# Patient Record
Sex: Female | Born: 1970 | Race: White | Hispanic: No | Marital: Married | Smoking: Former smoker
Health system: Southern US, Community
[De-identification: ages and names within clinical notes are randomized; demographics above are authoritative.]

## PROBLEM LIST (undated history)

## (undated) DIAGNOSIS — G43829 Menstrual migraine, not intractable, without status migrainosus: Secondary | ICD-10-CM

## (undated) DIAGNOSIS — N760 Acute vaginitis: Secondary | ICD-10-CM

## (undated) DIAGNOSIS — R519 Headache, unspecified: Secondary | ICD-10-CM

## (undated) DIAGNOSIS — E559 Vitamin D deficiency, unspecified: Secondary | ICD-10-CM

## (undated) DIAGNOSIS — R51 Headache: Secondary | ICD-10-CM

## (undated) DIAGNOSIS — E785 Hyperlipidemia, unspecified: Secondary | ICD-10-CM

## (undated) DIAGNOSIS — R87629 Unspecified abnormal cytological findings in specimens from vagina: Secondary | ICD-10-CM

## (undated) DIAGNOSIS — E039 Hypothyroidism, unspecified: Secondary | ICD-10-CM

## (undated) DIAGNOSIS — F3281 Premenstrual dysphoric disorder: Secondary | ICD-10-CM

## (undated) DIAGNOSIS — R638 Other symptoms and signs concerning food and fluid intake: Secondary | ICD-10-CM

## (undated) HISTORY — DX: Hypothyroidism, unspecified: E03.9

## (undated) HISTORY — DX: Acute vaginitis: N76.0

## (undated) HISTORY — DX: Hyperlipidemia, unspecified: E78.5

## (undated) HISTORY — DX: Headache, unspecified: R51.9

## (undated) HISTORY — DX: Headache: R51

## (undated) HISTORY — DX: Unspecified abnormal cytological findings in specimens from vagina: R87.629

## (undated) HISTORY — PX: TUBAL LIGATION: SHX77

## (undated) HISTORY — DX: Menstrual migraine, not intractable, without status migrainosus: G43.829

## (undated) HISTORY — PX: REFRACTIVE SURGERY: SHX103

## (undated) HISTORY — DX: Other symptoms and signs concerning food and fluid intake: R63.8

## (undated) HISTORY — DX: Vitamin D deficiency, unspecified: E55.9

## (undated) HISTORY — DX: Premenstrual dysphoric disorder: F32.81

---

## 2012-03-03 ENCOUNTER — Other Ambulatory Visit: Payer: Self-pay | Admitting: Family Medicine

## 2012-03-03 LAB — TSH: Thyroid Stimulating Horm: 2.14 u[IU]/mL

## 2012-12-07 ENCOUNTER — Other Ambulatory Visit: Payer: Self-pay | Admitting: Family Medicine

## 2012-12-07 LAB — LIPID PANEL
Cholesterol: 199 mg/dL (ref 0–200)
Ldl Cholesterol, Calc: 97 mg/dL (ref 0–100)
Triglycerides: 308 mg/dL — ABNORMAL HIGH (ref 0–200)

## 2012-12-07 LAB — TSH: Thyroid Stimulating Horm: 2.99 u[IU]/mL

## 2013-01-05 ENCOUNTER — Ambulatory Visit: Payer: Self-pay | Admitting: Obstetrics and Gynecology

## 2013-11-04 DIAGNOSIS — F32A Depression, unspecified: Secondary | ICD-10-CM | POA: Insufficient documentation

## 2013-11-04 DIAGNOSIS — R87619 Unspecified abnormal cytological findings in specimens from cervix uteri: Secondary | ICD-10-CM | POA: Insufficient documentation

## 2013-11-04 DIAGNOSIS — B977 Papillomavirus as the cause of diseases classified elsewhere: Secondary | ICD-10-CM | POA: Insufficient documentation

## 2013-11-04 DIAGNOSIS — F329 Major depressive disorder, single episode, unspecified: Secondary | ICD-10-CM | POA: Insufficient documentation

## 2014-01-06 DIAGNOSIS — E782 Mixed hyperlipidemia: Secondary | ICD-10-CM | POA: Insufficient documentation

## 2014-01-25 LAB — HM PAP SMEAR: HM PAP: NEGATIVE

## 2014-02-09 ENCOUNTER — Ambulatory Visit: Payer: Self-pay | Admitting: Obstetrics and Gynecology

## 2014-02-09 LAB — HM MAMMOGRAPHY

## 2014-07-07 DIAGNOSIS — E559 Vitamin D deficiency, unspecified: Secondary | ICD-10-CM | POA: Insufficient documentation

## 2014-07-07 DIAGNOSIS — J309 Allergic rhinitis, unspecified: Secondary | ICD-10-CM | POA: Insufficient documentation

## 2014-07-07 DIAGNOSIS — R946 Abnormal results of thyroid function studies: Secondary | ICD-10-CM | POA: Insufficient documentation

## 2015-01-30 ENCOUNTER — Encounter: Payer: Self-pay | Admitting: Obstetrics and Gynecology

## 2015-01-30 ENCOUNTER — Ambulatory Visit (INDEPENDENT_AMBULATORY_CARE_PROVIDER_SITE_OTHER): Payer: BC Managed Care – PPO | Admitting: Obstetrics and Gynecology

## 2015-01-30 VITALS — BP 136/74 | HR 83 | Ht 65.0 in | Wt 185.8 lb

## 2015-01-30 DIAGNOSIS — Z202 Contact with and (suspected) exposure to infections with a predominantly sexual mode of transmission: Secondary | ICD-10-CM | POA: Diagnosis not present

## 2015-01-30 DIAGNOSIS — R638 Other symptoms and signs concerning food and fluid intake: Secondary | ICD-10-CM | POA: Diagnosis not present

## 2015-01-30 DIAGNOSIS — N762 Acute vulvitis: Secondary | ICD-10-CM | POA: Insufficient documentation

## 2015-01-30 DIAGNOSIS — G43909 Migraine, unspecified, not intractable, without status migrainosus: Secondary | ICD-10-CM | POA: Insufficient documentation

## 2015-01-30 DIAGNOSIS — Z01419 Encounter for gynecological examination (general) (routine) without abnormal findings: Secondary | ICD-10-CM

## 2015-01-30 DIAGNOSIS — Z1239 Encounter for other screening for malignant neoplasm of breast: Secondary | ICD-10-CM | POA: Diagnosis not present

## 2015-01-30 NOTE — Patient Instructions (Signed)
1.  Pap smear/HPV testing is done today. 2.  STD testing is done today. 3.  Mammogram is ordered. 4.  Healthy eating and exercise with the goal to achieve temp at weight loss over one year.  Is encouraged. 5.  Nystatin/triamcinolone cream is prescribed to treat vulvar irritation.  Apply medication to the vulva and perianal region twice a day for 10 days to 14 days. 6.  Return in 1 year.

## 2015-01-30 NOTE — Progress Notes (Signed)
Patient ID: Nancy CousinsSue Patterson, female   DOB: Aug 02, 1970, 45 y.o.   MRN: [id number] ANNUAL PREVENTATIVE CARE GYN  ENCOUNTER NOTE  Subjective:       Nancy CousinsSue Patterson is a 45 y.o. 522P2002 female here for a routine annual gynecologic exam.  Current complaints: 1.  Std testing-Patient is going through divorce.  Patient reports regular cycles, but with slightly heavier bleeding and 1-2 days of clots; no intermenstrual spotting.  No vaginal discharge.She has lost 2 pounds in the past year.   Gynecologic History Patient's last menstrual period was 01/24/2015 (exact date). Contraception: tubal ligation Last Pap: 01/25/2014 pap w/rflx- neg. Results were: normal Last mammogram: 02/09/2014 -birad 1. Results were: normal  Obstetric History OB History  Gravida Para Term Preterm AB SAB TAB Ectopic Multiple Living  2 2 2       2     # Outcome Date GA Lbr Len/2nd Weight Sex Delivery Anes PTL Lv  2 Term      Vag-Spont   Y  1 Term      Vag-Spont   Y      Past Medical History  Diagnosis Date  . Vaginal Pap smear, abnormal     cervical dysplasia  . Hypothyroidism   . Headache     migraine  . Vaginitis   . Menstrual headache   . Vitamin D deficiency   . PMDD (premenstrual dysphoric disorder)   . Increased BMI     Past Surgical History  Procedure Laterality Date  . Tubal ligation    . Refractive surgery      No current outpatient prescriptions on file prior to visit.   No current facility-administered medications on file prior to visit.    Allergies  Allergen Reactions  . Azithromycin     Social History   Social History  . Marital Status: Divorced    Spouse Name: N/A  . Number of Children: N/A  . Years of Education: N/A   Occupational History  . Not on file.   Social History Main Topics  . Smoking status: Former Games developermoker  . Smokeless tobacco: Not on file  . Alcohol Use: Yes     Comment: occas  . Drug Use: No  . Sexual Activity: Yes    Birth Control/ Protection: Surgical   Other  Topics Concern  . Not on file   Social History Narrative    Family History  Problem Relation Age of Onset  . Heart disease Father   . Colon cancer Paternal Uncle   . Hyperlipidemia Paternal Grandmother   . Breast cancer Neg Hx   . Ovarian cancer Neg Hx   . Diabetes Mother     The following portions of the patient's history were reviewed and updated as appropriate: allergies, current medications, past family history, past medical history, past social history, past surgical history and problem list.  Review of Systems ROS Review of Systems - General ROS: negative for - chills, fatigue, fever, hot flashes, night sweats, weight gain or weight loss Psychological ROS: negative for - anxiety, decreased libido, depression, mood swings, physical abuse or sexual abuse Ophthalmic ROS: negative for - blurry vision, eye pain or loss of vision ENT ROS: negative for - headaches, hearing change, visual changes or vocal changes Allergy and Immunology ROS: negative for - hives, itchy/watery eyes or seasonal allergies Hematological and Lymphatic ROS: negative for - bleeding problems, bruising, swollen lymph nodes or weight loss Endocrine ROS: negative for - galactorrhea, hair pattern changes, hot flashes, malaise/lethargy, mood swings,  palpitations, polydipsia/polyuria, skin changes, temperature intolerance or unexpected weight changes Breast ROS: negative for - new or changing breast lumps or nipple discharge Respiratory ROS: negative for - cough or shortness of breath Cardiovascular ROS: negative for - chest pain, irregular heartbeat, palpitations or shortness of breath Gastrointestinal ROS: no abdominal pain, change in bowel habits, or black or bloody stools Genito-Urinary ROS: no dysuria, trouble voiding, or hematuria Musculoskeletal ROS: negative for - joint pain or joint stiffness Neurological ROS: negative for - bowel and bladder control changes Dermatological ROS: negative for rash and skin  lesion changes   Objective:   BP 136/74 mmHg  Pulse 83  Ht 5\' 5"  (1.651 m)  Wt 185 lb 12.8 oz (84.278 kg)  BMI 30.92 kg/m2  LMP 01/24/2015 (Exact Date) CONSTITUTIONAL: Well-developed, well-nourished female in no acute distress.  PSYCHIATRIC: Normal mood and affect. Normal behavior. Normal judgment and thought content. NEUROLGIC: Alert and oriented to person, place, and time. Normal muscle tone coordination. No cranial nerve deficit noted. HENT:  Normocephalic, atraumatic, External right and left ear normal. Oropharynx is clear and moist EYES: Conjunctivae and EOM are normal. Pupils are equal, round, and reactive to light. No scleral icterus.  NECK: Normal range of motion, supple, no masses.  Normal thyroid.  SKIN: Skin is warm and dry. No rash noted. Not diaphoretic. No erythema. No pallor. CARDIOVASCULAR: Normal heart rate noted, regular rhythm, no murmur. RESPIRATORY: Clear to auscultation bilaterally. Effort and breath sounds normal, no problems with respiration noted. BREASTS: Symmetric in size. No masses, skin changes, nipple drainage, or lymphadenopathy. ABDOMEN: Soft, normal bowel sounds, no distention noted.  No tenderness, rebound or guarding.  BLADDER: Normal PELVIC:  External Genitalia: Vulvar and perianal hyperemia  BUS: Normal  Vagina: Normal  Cervix: Normal  Uterus: Normal  Adnexa: Normal  RV: External Exam NormaI, No Rectal Masses and Normal Sphincter tone  MUSCULOSKELETAL: Normal range of motion. No tenderness.  No cyanosis, clubbing, or edema.  2+ distal pulses. LYMPHATIC: No Axillary, Supraclavicular, or Inguinal Adenopathy.    Assessment:   Annual gynecologic examination 45 y.o. Contraception: tubal ligation bmi31 Possible STD exposure (patient going through divorce). Vulvitis/perianal inflammation  Plan:  Pap: Pap Co Test and g/c Mammogram: Ordered Stool Guaiac Testing:  Not Indicated Labs: thru pcp Routine preventative health maintenance measures  emphasized: Exercise/Diet/Weight control, Tobacco Warnings, Alcohol/Substance use risks and Safe Sex Nystatin/triamcinolone cream to be applied to the vulva twice a day for 14 days. Encourage weight loss, approximately 10 pounds per year Through Healthy exercise. STI testing is completed today Return to Clinic - 1 Year   Crystal [place], CMA  Nancy Harms, MD  Note: This dictation was prepared with Dragon dictation along with smaller phrase technology. Any transcriptional errors that result from this process are unintentional.

## 2015-01-31 ENCOUNTER — Encounter: Payer: Self-pay | Admitting: Obstetrics and Gynecology

## 2015-01-31 LAB — HSV(HERPES SIMPLEX VRS) I + II AB-IGG
HSV 1 GLYCOPROTEIN G AB, IGG: 7.17 {index} — AB (ref 0.00–0.90)
HSV 2 Glycoprotein G Ab, IgG: <0.91 index (ref 0.00–0.90)

## 2015-01-31 LAB — HIV ANTIBODY (ROUTINE TESTING W REFLEX): HIV SCREEN 4TH GENERATION: NONREACTIVE

## 2015-01-31 LAB — RPR: RPR Ser Ql: NONREACTIVE

## 2015-01-31 LAB — HEPATITIS C ANTIBODY

## 2015-01-31 LAB — HEPATITIS B SURFACE ANTIGEN: Hepatitis B Surface Ag: NEGATIVE

## 2015-02-01 MED ORDER — NYSTATIN-TRIAMCINOLONE 100000-0.1 UNIT/GM-% EX OINT: 1.0000 "application " | TOPICAL_OINTMENT | Freq: Two times a day (BID) | CUTANEOUS | Status: DC

## 2015-02-01 NOTE — Telephone Encounter (Signed)
Pt aware thru my chart message. Med rex.

## 2015-02-02 LAB — PAP IG, CT-NG NAA, HPV HIGH-RISK
CHLAMYDIA, NUC. ACID AMP: NEGATIVE
GONOCOCCUS BY NUCLEIC ACID AMP: NEGATIVE
HPV, high-risk: NEGATIVE
PAP Smear Comment: 0

## 2015-02-12 ENCOUNTER — Ambulatory Visit
Admission: RE | Admit: 2015-02-12 | Discharge: 2015-02-12 | Disposition: A | Discharge status: Home / Self Care | Payer: BC Managed Care – PPO | Source: Ambulatory Visit | Attending: Obstetrics and Gynecology | Admitting: Obstetrics and Gynecology

## 2015-02-12 ENCOUNTER — Other Ambulatory Visit: Payer: Self-pay | Admitting: Obstetrics and Gynecology

## 2015-02-12 DIAGNOSIS — Z1239 Encounter for other screening for malignant neoplasm of breast: Secondary | ICD-10-CM

## 2015-02-12 DIAGNOSIS — Z1231 Encounter for screening mammogram for malignant neoplasm of breast: Secondary | ICD-10-CM | POA: Diagnosis not present

## 2016-01-31 ENCOUNTER — Encounter: Payer: Self-pay | Admitting: Obstetrics and Gynecology

## 2016-01-31 ENCOUNTER — Ambulatory Visit (INDEPENDENT_AMBULATORY_CARE_PROVIDER_SITE_OTHER): Payer: BC Managed Care – PPO | Admitting: Obstetrics and Gynecology

## 2016-01-31 VITALS — BP 97/65 | HR 93 | Ht 65.0 in | Wt 189.0 lb

## 2016-01-31 DIAGNOSIS — Z01419 Encounter for gynecological examination (general) (routine) without abnormal findings: Secondary | ICD-10-CM | POA: Diagnosis not present

## 2016-01-31 DIAGNOSIS — R638 Other symptoms and signs concerning food and fluid intake: Secondary | ICD-10-CM | POA: Diagnosis not present

## 2016-01-31 DIAGNOSIS — Z202 Contact with and (suspected) exposure to infections with a predominantly sexual mode of transmission: Secondary | ICD-10-CM

## 2016-01-31 DIAGNOSIS — E039 Hypothyroidism, unspecified: Secondary | ICD-10-CM

## 2016-01-31 DIAGNOSIS — E559 Vitamin D deficiency, unspecified: Secondary | ICD-10-CM

## 2016-01-31 DIAGNOSIS — Z1239 Encounter for other screening for malignant neoplasm of breast: Secondary | ICD-10-CM

## 2016-01-31 DIAGNOSIS — E785 Hyperlipidemia, unspecified: Secondary | ICD-10-CM

## 2016-01-31 DIAGNOSIS — Z1231 Encounter for screening mammogram for malignant neoplasm of breast: Secondary | ICD-10-CM

## 2016-01-31 NOTE — Progress Notes (Signed)
ANNUAL PREVENTATIVE CARE GYN  ENCOUNTER NOTE  Subjective:       Nancy Patterson is a 46 y.o. [id number] female here for a routine annual gynecologic exam.  Current complaints: 1.  None  Patient is monogamous; desires STD screening. Bowel function is normal. Bladder function is normal. No major no interval health issues.   Gynecologic History Patient's last menstrual period was 01/03/2016 (exact date). Contraception: tubal ligation Last Pap: 01/2015 neg/neg. Results were: normal Last mammogram: 02/12/2015 birad 1. Results were: normal  Obstetric History OB History  Gravida Para Term Preterm AB Living  2 2 2     2   SAB TAB Ectopic Multiple Live Births          2    # Outcome Date GA Lbr Len/2nd Weight Sex Delivery Anes PTL Lv  2 Term      Vag-Spont   LIV  1 Term      Vag-Spont   LIV      Past Medical History:  Diagnosis Date  . Headache    migraine  . Hypothyroidism   . Increased BMI   . Menstrual headache   . PMDD (premenstrual dysphoric disorder)   . Vaginal Pap smear, abnormal    cervical dysplasia  . Vaginitis   . Vitamin D deficiency     Past Surgical History:  Procedure Laterality Date  . REFRACTIVE SURGERY    . TUBAL LIGATION      Current Outpatient Prescriptions on File Prior to Visit  Medication Sig Dispense Refill  . atorvastatin (LIPITOR) 10 MG tablet     . citalopram (CELEXA) 40 MG tablet Take by mouth.    . clonazePAM (KLONOPIN) 1 MG tablet Take by mouth.    . levothyroxine (SYNTHROID, LEVOTHROID) 25 MCG tablet     . Multiple Vitamin (MULTIVITAMIN) capsule Take 1 capsule by mouth daily.    Marland Kitchen. RA KRILL OIL 500 MG CAPS Take by mouth.     No current facility-administered medications on file prior to visit.     Allergies  Allergen Reactions  . Azithromycin     Social History   Social History  . Marital status: Divorced    Spouse name: N/A  . Number of children: N/A  . Years of education: N/A   Occupational History  . Not on file.   Social  History Main Topics  . Smoking status: Former Games developermoker  . Smokeless tobacco: Not on file  . Alcohol use Yes     Comment: occas  . Drug use: No  . Sexual activity: Yes    Birth control/ protection: Surgical   Other Topics Concern  . Not on file   Social History Narrative  . No narrative on file    Family History  Problem Relation Age of Onset  . Heart disease Father   . Colon cancer Paternal Uncle   . Hyperlipidemia Paternal Grandmother   . Diabetes Mother   . Breast cancer Neg Hx   . Ovarian cancer Neg Hx     The following portions of the patient's history were reviewed and updated as appropriate: allergies, current medications, past family history, past medical history, past social history, past surgical history and problem list.  Review of Systems ROS Review of Systems - General ROS: negative for - chills, fatigue, fever, hot flashes, night sweats, weight gain or weight loss Psychological ROS: negative for - anxiety, decreased libido, depression, mood swings, physical abuse or sexual abuse Ophthalmic ROS: negative for - blurry vision, eye  pain or loss of vision ENT ROS: negative for - headaches, hearing change, visual changes or vocal changes Allergy and Immunology ROS: negative for - hives, itchy/watery eyes or seasonal allergies Hematological and Lymphatic ROS: negative for - bleeding problems, bruising, swollen lymph nodes or weight loss Endocrine ROS: negative for - galactorrhea, hair pattern changes, hot flashes, malaise/lethargy, mood swings, palpitations, polydipsia/polyuria, skin changes, temperature intolerance or unexpected weight changes Breast ROS: negative for - new or changing breast lumps or nipple discharge Respiratory ROS: negative for - cough or shortness of breath Cardiovascular ROS: negative for - chest pain, irregular heartbeat, palpitations or shortness of breath Gastrointestinal ROS: no abdominal pain, change in bowel habits, or black or bloody  stools Genito-Urinary ROS: no dysuria, trouble voiding, or hematuria Musculoskeletal ROS: negative for - joint pain or joint stiffness Neurological ROS: negative for - bowel and bladder control changes Dermatological ROS: negative for rash and skin lesion changes   Objective:   BP 97/65   Pulse 93   Ht 5\' 5"  (1.651 m)   Wt 189 lb (85.7 kg)   LMP 01/03/2016 (Exact Date)   BMI 31.45 kg/m  CONSTITUTIONAL: Well-developed, well-nourished female in no acute distress.  PSYCHIATRIC: Normal mood and affect. Normal behavior. Normal judgment and thought content. NEUROLGIC: Alert and oriented to person, place, and time. Normal muscle tone coordination. No cranial nerve deficit noted. HENT:  Normocephalic, atraumatic, External right and left ear normal. Oropharynx is clear and moist EYES: Conjunctivae and EOM are normal.. No scleral icterus.  NECK: Normal range of motion, supple, no masses.  Normal thyroid.  SKIN: Skin is warm and dry. No rash noted. Not diaphoretic. No erythema. No pallor. CARDIOVASCULAR: Normal heart rate noted, regular rhythm, no murmur. RESPIRATORY: Clear to auscultation bilaterally. Effort and breath sounds normal, no problems with respiration noted. BREASTS: Symmetric in size. No masses, skin changes, nipple drainage, or lymphadenopathy. ABDOMEN: Soft, normal bowel sounds, no distention noted.  No tenderness, rebound or guarding.  BLADDER: Normal PELVIC:  External Genitalia: Normal  BUS: Normal  Vagina: Normal  Cervix: Normal; parous; no lesions  Uterus: Normal; midplane, normal size and shape, mobile, nontender  Adnexa: Normal; nonpalpable and nontender  RV: External Exam NormaI, No Rectal Masses and Normal Sphincter tone  MUSCULOSKELETAL: Normal range of motion. No tenderness.  No cyanosis, clubbing, or edema.  2+ distal pulses. LYMPHATIC: No Axillary, Supraclavicular, or Inguinal Adenopathy.    Assessment:   Annual gynecologic examination 46 y.o. Contraception:  tubal ligation bmi- 31 History of cervical dysplasia STD exposure, possible; desires screening  Plan:  Pap: Pap, Reflex if ASCUS and GC/CT NAAT- per pt request Mammogram: Ordered Stool Guaiac Testing:  Not Indicated Labs: vit d lipids tsh a1c fbs and std testing Routine preventative health maintenance measures emphasized: Exercise/Diet/Weight control, Tobacco Warnings, Alcohol/Substance use risks and Safe Sex Return to Clinic - 1 Year   Crystal [place], [place]  Herold Harms, MD  Note: This dictation was prepared with Dragon dictation along with smaller phrase technology. Any transcriptional errors that result from this process are unintentional.

## 2016-01-31 NOTE — Patient Instructions (Addendum)
1. Pap smear 2. STD testing 3. Mammogram ordered 4. Screening labs are ordered 5. Continue with healthy eating, exercise, and controlled weight loss 6. Safe sex practices encouraged 7. Return in 1 year for annual exam  Health Maintenance, Female Introduction Adopting a healthy lifestyle and getting preventive care can go a long way to promote health and wellness. Talk with your health care provider about what schedule of regular examinations is right for you. This is a good chance for you to check in with your provider about disease prevention and staying healthy. In between checkups, there are plenty of things you can do on your own. Experts have done a lot of research about which lifestyle changes and preventive measures are most likely to keep you healthy. Ask your health care provider for more information. Weight and diet Eat a healthy diet  Be sure to include plenty of vegetables, fruits, low-fat dairy products, and lean protein.  Do not eat a lot of foods high in solid fats, added sugars, or salt.  Get regular exercise. This is one of the most important things you can do for your health.  Most adults should exercise for at least 150 minutes each week. The exercise should increase your heart rate and make you sweat (moderate-intensity exercise).  Most adults should also do strengthening exercises at least twice a week. This is in addition to the moderate-intensity exercise. Maintain a healthy weight  Body mass index (BMI) is a measurement that can be used to identify possible weight problems. It estimates body fat based on height and weight. Your health care provider can help determine your BMI and help you achieve or maintain a healthy weight.  For females 40 years of age and older:  A BMI below 18.5 is considered underweight.  A BMI of 18.5 to 24.9 is normal.  A BMI of 25 to 29.9 is considered overweight.  A BMI of 30 and above is considered obese. Watch levels of  cholesterol and blood lipids  You should start having your blood tested for lipids and cholesterol at 46 years of age, then have this test every 5 years.  You may need to have your cholesterol levels checked more often if:  Your lipid or cholesterol levels are high.  You are older than 46 years of age.  You are at high risk for heart disease. Cancer screening Lung Cancer  Lung cancer screening is recommended for adults 62-32 years old who are at high risk for lung cancer because of a history of smoking.  A yearly low-dose CT scan of the lungs is recommended for people who:  Currently smoke.  Have quit within the past 15 years.  Have at least a 30-pack-year history of smoking. A pack year is smoking an average of one pack of cigarettes a day for 1 year.  Yearly screening should continue until it has been 15 years since you quit.  Yearly screening should stop if you develop a health problem that would prevent you from having lung cancer treatment. Breast Cancer  Practice breast self-awareness. This means understanding how your breasts normally appear and feel.  It also means doing regular breast self-exams. Let your health care provider know about any changes, no matter how small.  If you are in your 20s or 30s, you should have a clinical breast exam (CBE) by a health care provider every 1-3 years as part of a regular health exam.  If you are 56 or older, have a CBE every year.  Also consider having a breast X-ray (mammogram) every year.  If you have a family history of breast cancer, talk to your health care provider about genetic screening.  If you are at high risk for breast cancer, talk to your health care provider about having an MRI and a mammogram every year.  Breast cancer gene (BRCA) assessment is recommended for women who have family members with BRCA-related cancers. BRCA-related cancers include:  Breast.  Ovarian.  Tubal.  Peritoneal cancers.  Results of  the assessment will determine the need for genetic counseling and BRCA1 and BRCA2 testing. Cervical Cancer  Your health care provider may recommend that you be screened regularly for cancer of the pelvic organs (ovaries, uterus, and vagina). This screening involves a pelvic examination, including checking for microscopic changes to the surface of your cervix (Pap test). You may be encouraged to have this screening done every 3 years, beginning at age 89.  For women ages 57-65, health care providers may recommend pelvic exams and Pap testing every 3 years, or they may recommend the Pap and pelvic exam, combined with testing for human papilloma virus (HPV), every 5 years. Some types of HPV increase your risk of cervical cancer. Testing for HPV may also be done on women of any age with unclear Pap test results.  Other health care providers may not recommend any screening for nonpregnant women who are considered low risk for pelvic cancer and who do not have symptoms. Ask your health care provider if a screening pelvic exam is right for you.  If you have had past treatment for cervical cancer or a condition that could lead to cancer, you need Pap tests and screening for cancer for at least 20 years after your treatment. If Pap tests have been discontinued, your risk factors (such as having a new sexual partner) need to be reassessed to determine if screening should resume. Some women have medical problems that increase the chance of getting cervical cancer. In these cases, your health care provider may recommend more frequent screening and Pap tests. Colorectal Cancer  This type of cancer can be detected and often prevented.  Routine colorectal cancer screening usually begins at 46 years of age and continues through 46 years of age.  Your health care provider may recommend screening at an earlier age if you have risk factors for colon cancer.  Your health care provider may also recommend using home test  kits to check for hidden blood in the stool.  A small camera at the end of a tube can be used to examine your colon directly (sigmoidoscopy or colonoscopy). This is done to check for the earliest forms of colorectal cancer.  Routine screening usually begins at age 57.  Direct examination of the colon should be repeated every 5-10 years through 46 years of age. However, you may need to be screened more often if early forms of precancerous polyps or small growths are found. Skin Cancer  Check your skin from head to toe regularly.  Tell your health care provider about any new moles or changes in moles, especially if there is a change in a mole's shape or color.  Also tell your health care provider if you have a mole that is larger than the size of a pencil eraser.  Always use sunscreen. Apply sunscreen liberally and repeatedly throughout the day.  Protect yourself by wearing long sleeves, pants, a wide-brimmed hat, and sunglasses whenever you are outside. Heart disease, diabetes, and high blood pressure  High blood pressure causes heart disease and increases the risk of stroke. High blood pressure is more likely to develop in:  People who have blood pressure in the high end of the normal range (130-139/85-89 mm Hg).  People who are overweight or obese.  People who are African American.  If you are 36-29 years of age, have your blood pressure checked every 3-5 years. If you are 71 years of age or older, have your blood pressure checked every year. You should have your blood pressure measured twice-once when you are at a hospital or clinic, and once when you are not at a hospital or clinic. Record the average of the two measurements. To check your blood pressure when you are not at a hospital or clinic, you can use:  An automated blood pressure machine at a pharmacy.  A home blood pressure monitor.  If you are between 23 years and 75 years old, ask your health care provider if you should  take aspirin to prevent strokes.  Have regular diabetes screenings. This involves taking a blood sample to check your fasting blood sugar level.  If you are at a normal weight and have a low risk for diabetes, have this test once every three years after 46 years of age.  If you are overweight and have a high risk for diabetes, consider being tested at a younger age or more often. Preventing infection Hepatitis B  If you have a higher risk for hepatitis B, you should be screened for this virus. You are considered at high risk for hepatitis B if:  You were born in a country where hepatitis B is common. Ask your health care provider which countries are considered high risk.  Your parents were born in a high-risk country, and you have not been immunized against hepatitis B (hepatitis B vaccine).  You have HIV or AIDS.  You use needles to inject street drugs.  You live with someone who has hepatitis B.  You have had sex with someone who has hepatitis B.  You get hemodialysis treatment.  You take certain medicines for conditions, including cancer, organ transplantation, and autoimmune conditions. Hepatitis C  Blood testing is recommended for:  Everyone born from [birth year] through 1965.  Anyone with known risk factors for hepatitis C. Sexually transmitted infections (STIs)  You should be screened for sexually transmitted infections (STIs) including gonorrhea and chlamydia if:  You are sexually active and are younger than 45 years of age.  You are older than 46 years of age and your health care provider tells you that you are at risk for this type of infection.  Your sexual activity has changed since you were last screened and you are at an increased risk for chlamydia or gonorrhea. Ask your health care provider if you are at risk.  If you do not have HIV, but are at risk, it may be recommended that you take a prescription medicine daily to prevent HIV infection. This is called  pre-exposure prophylaxis (PrEP). You are considered at risk if:  You are sexually active and do not regularly use condoms or know the HIV status of your partner(s).  You take drugs by injection.  You are sexually active with a partner who has HIV. Talk with your health care provider about whether you are at high risk of being infected with HIV. If you choose to begin PrEP, you should first be tested for HIV. You should then be tested every 3 months for as long  as you are taking PrEP. Pregnancy  If you are premenopausal and you may become pregnant, ask your health care provider about preconception counseling.  If you may become pregnant, take 400 to 800 micrograms (mcg) of folic acid every day.  If you want to prevent pregnancy, talk to your health care provider about birth control (contraception). Osteoporosis and menopause  Osteoporosis is a disease in which the bones lose minerals and strength with aging. This can result in serious bone fractures. Your risk for osteoporosis can be identified using a bone density scan.  If you are 68 years of age or older, or if you are at risk for osteoporosis and fractures, ask your health care provider if you should be screened.  Ask your health care provider whether you should take a calcium or vitamin D supplement to lower your risk for osteoporosis.  Menopause may have certain physical symptoms and risks.  Hormone replacement therapy may reduce some of these symptoms and risks. Talk to your health care provider about whether hormone replacement therapy is right for you. Follow these instructions at home:  Schedule regular health, dental, and eye exams.  Stay current with your immunizations.  Do not use any tobacco products including cigarettes, chewing tobacco, or electronic cigarettes.  If you are pregnant, do not drink alcohol.  If you are breastfeeding, limit how much and how often you drink alcohol.  Limit alcohol intake to no more  than 1 drink per day for nonpregnant women. One drink equals 12 ounces of beer, 5 ounces of wine, or 1 ounces of hard liquor.  Do not use street drugs.  Do not share needles.  Ask your health care provider for help if you need support or information about quitting drugs.  Tell your health care provider if you often feel depressed.  Tell your health care provider if you have ever been abused or do not feel safe at home. This information is not intended to replace advice given to you by your health care provider. Make sure you discuss any questions you have with your health care provider. Document Released: 07/22/2010 Document Revised: 06/14/2015 Document Reviewed: 10/10/2014  2017 [employer]vier

## 2016-02-01 LAB — VITAMIN D 25 HYDROXY (VIT D DEFICIENCY, FRACTURES): VIT D 25 HYDROXY: 38.9 ng/mL (ref 30.0–100.0)

## 2016-02-04 LAB — PAP IG, CT-NG, RFX HPV ASCU
CHLAMYDIA, NUC. ACID AMP: NEGATIVE
Gonococcus by Nucleic Acid Amp: NEGATIVE
PAP Smear Comment: 0

## 2016-02-05 LAB — LIPID PANEL
Chol/HDL Ratio: 2.9 ratio units (ref 0.0–4.4)
Cholesterol, Total: 113 mg/dL (ref 100–199)
HDL: 39 mg/dL — AB (ref >39)
LDL Calculated: 28 mg/dL (ref 0–99)
TRIGLYCERIDES: 230 mg/dL — AB (ref 0–149)
VLDL CHOLESTEROL CAL: 46 mg/dL — AB (ref 5–40)

## 2016-02-05 LAB — HIV ANTIBODY (ROUTINE TESTING W REFLEX): HIV Screen 4th Generation wRfx: NONREACTIVE

## 2016-02-05 LAB — HSV(HERPES SIMPLEX VRS) I + II AB-IGG: HSV 1 Glycoprotein G Ab, IgG: 8.6 index — ABNORMAL HIGH (ref 0.00–0.90)

## 2016-02-05 LAB — RPR: RPR Ser Ql: NONREACTIVE

## 2016-02-05 LAB — HEMOGLOBIN A1C
ESTIMATED AVERAGE GLUCOSE: 100 mg/dL
Hgb A1c MFr Bld: 5.1 % (ref 4.8–5.6)

## 2016-02-05 LAB — HEPATITIS B SURFACE ANTIGEN: HEP B S AG: NEGATIVE

## 2016-02-05 LAB — HEPATITIS C ANTIBODY

## 2016-02-05 LAB — TSH: TSH: 4.12 u[IU]/mL (ref 0.450–4.500)

## 2016-02-05 LAB — GLUCOSE, RANDOM: Glucose: 95 mg/dL (ref 65–99)

## 2016-02-13 ENCOUNTER — Encounter: Payer: Self-pay | Admitting: Obstetrics and Gynecology

## 2016-02-27 ENCOUNTER — Other Ambulatory Visit: Payer: Self-pay | Admitting: Family Medicine

## 2016-02-27 DIAGNOSIS — E039 Hypothyroidism, unspecified: Secondary | ICD-10-CM

## 2016-02-27 DIAGNOSIS — R946 Abnormal results of thyroid function studies: Principal | ICD-10-CM

## 2016-03-05 ENCOUNTER — Ambulatory Visit
Admission: RE | Admit: 2016-03-05 | Discharge: 2016-03-05 | Disposition: A | Discharge status: Home / Self Care | Payer: BC Managed Care – PPO | Source: Ambulatory Visit | Attending: Family Medicine | Admitting: Family Medicine

## 2016-03-05 DIAGNOSIS — R946 Abnormal results of thyroid function studies: Secondary | ICD-10-CM

## 2016-03-05 DIAGNOSIS — E042 Nontoxic multinodular goiter: Secondary | ICD-10-CM | POA: Insufficient documentation

## 2016-03-05 DIAGNOSIS — E039 Hypothyroidism, unspecified: Secondary | ICD-10-CM

## 2016-04-16 ENCOUNTER — Ambulatory Visit
Admission: RE | Admit: 2016-04-16 | Discharge: 2016-04-16 | Disposition: A | Discharge status: Home / Self Care | Payer: BC Managed Care – PPO | Source: Ambulatory Visit | Attending: Obstetrics and Gynecology | Admitting: Obstetrics and Gynecology

## 2016-04-16 DIAGNOSIS — Z1231 Encounter for screening mammogram for malignant neoplasm of breast: Secondary | ICD-10-CM | POA: Insufficient documentation

## 2016-04-16 DIAGNOSIS — Z1239 Encounter for other screening for malignant neoplasm of breast: Secondary | ICD-10-CM

## 2016-08-26 ENCOUNTER — Other Ambulatory Visit: Payer: Self-pay | Admitting: Family Medicine

## 2016-08-26 DIAGNOSIS — E041 Nontoxic single thyroid nodule: Secondary | ICD-10-CM

## 2016-09-08 IMAGING — MG MM DIGITAL SCREENING BILAT W/ TOMO W/ CAD
9 of 14 series · 9 of 30 positions shown · non-contrast
Comparison: Previous exam(s).

CLINICAL DATA: Screening.

EXAM:
DIGITAL SCREENING BILATERAL MAMMOGRAM WITH 3D TOMO WITH CAD

[L MLO (1 of 2)]
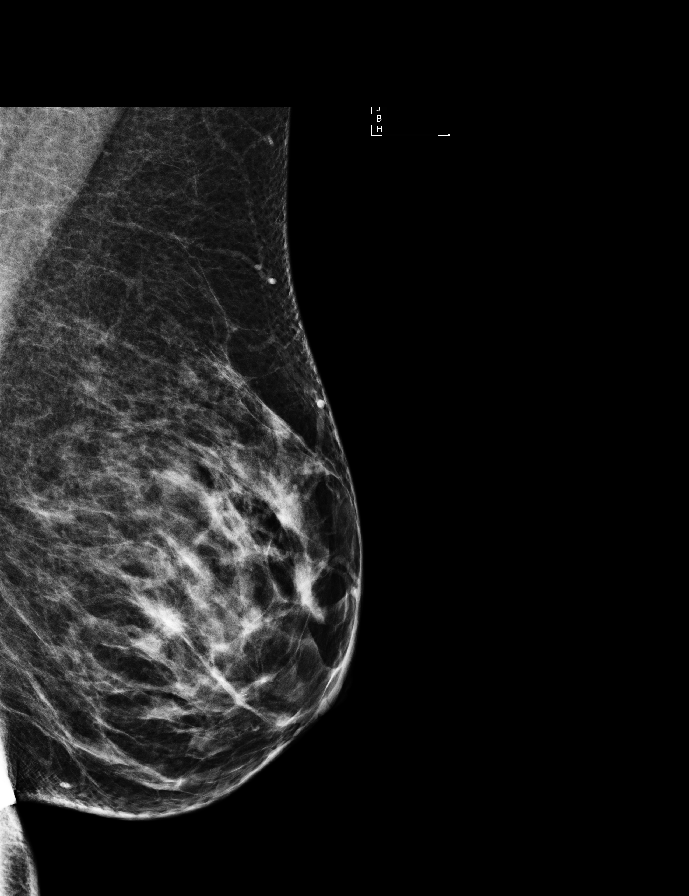

[R MLO (1 of 2)]
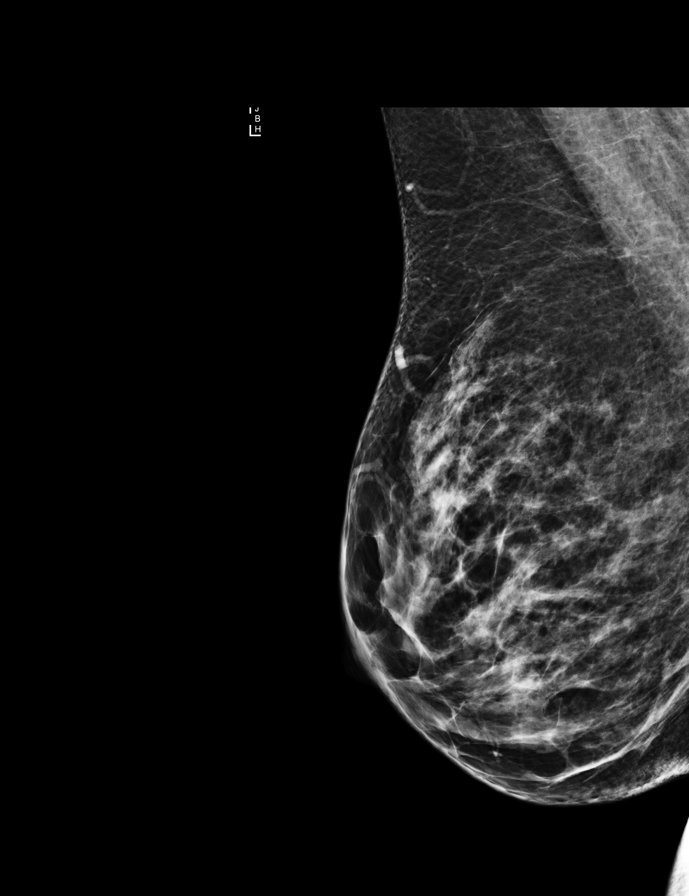

[R MLO (2 of 2)]
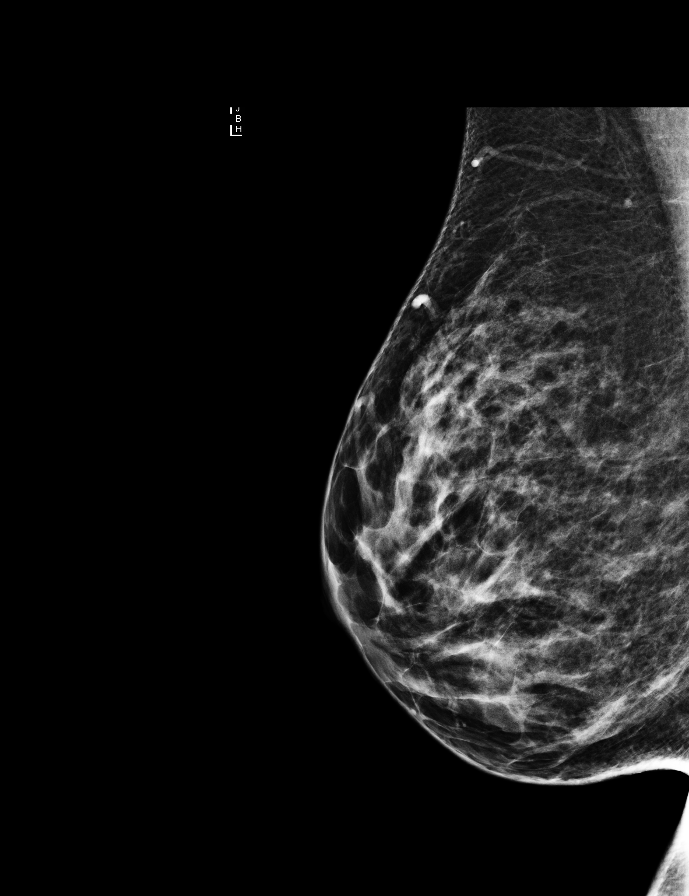

[R MLO synth-2D]
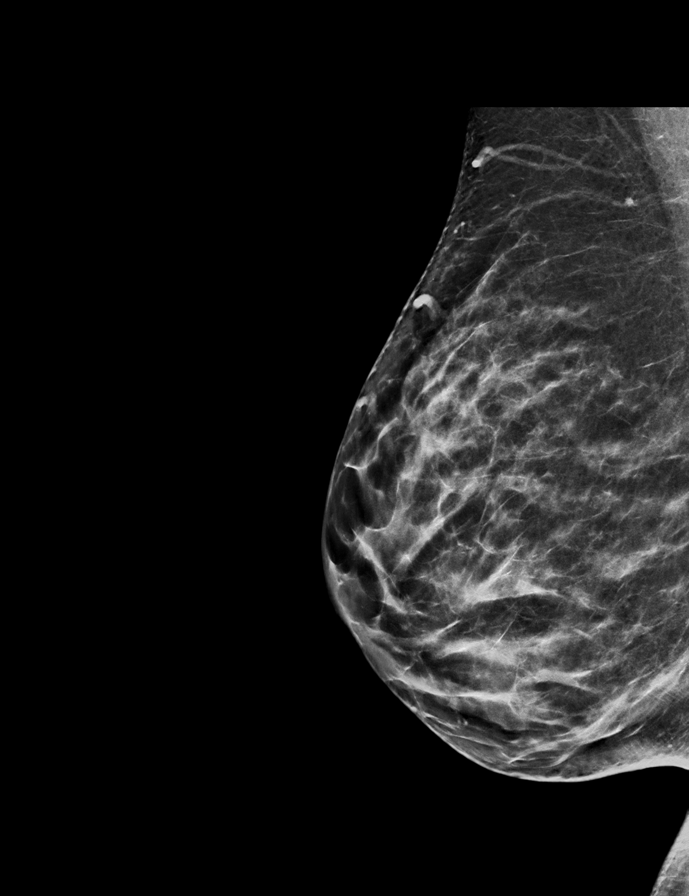

[L MLO (2 of 2)]
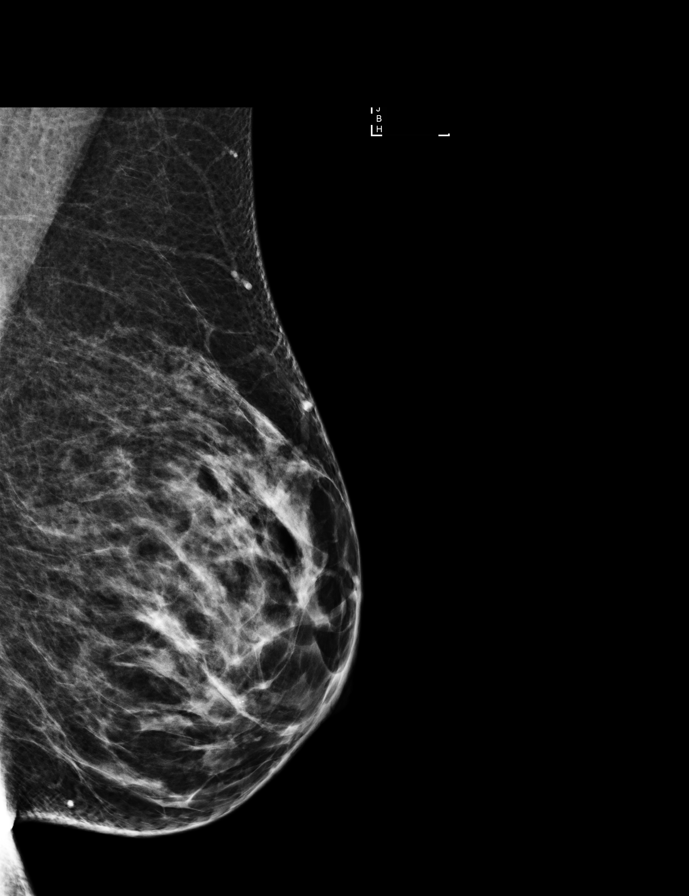

[R CC]
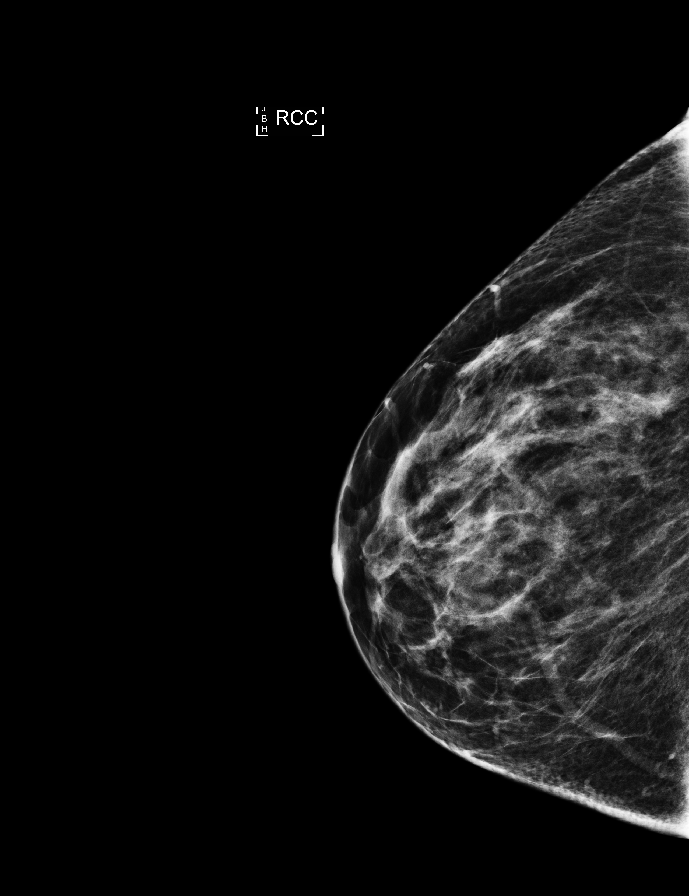

[L CC synth-2D]
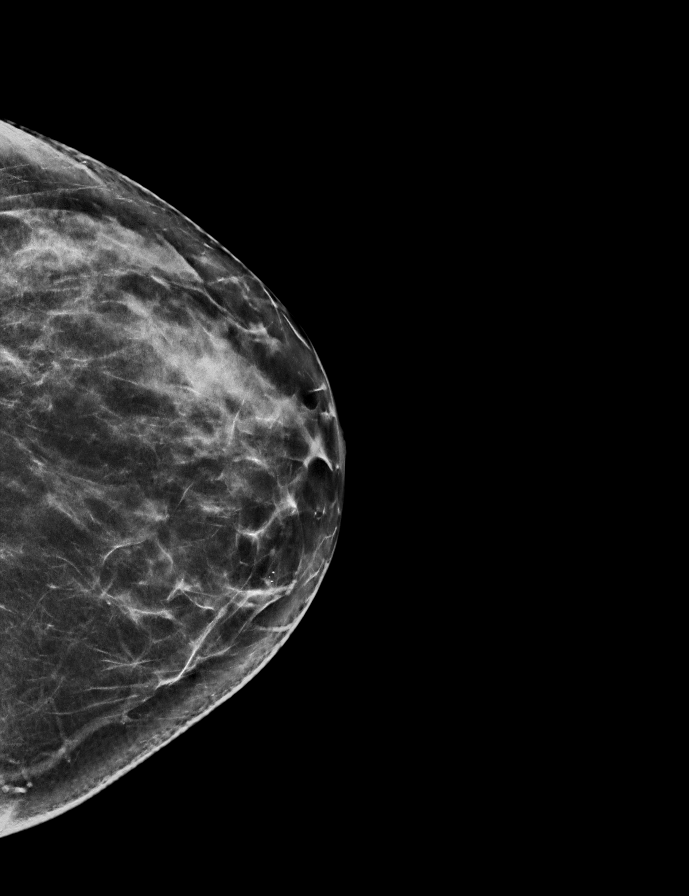

[R CC synth-2D]
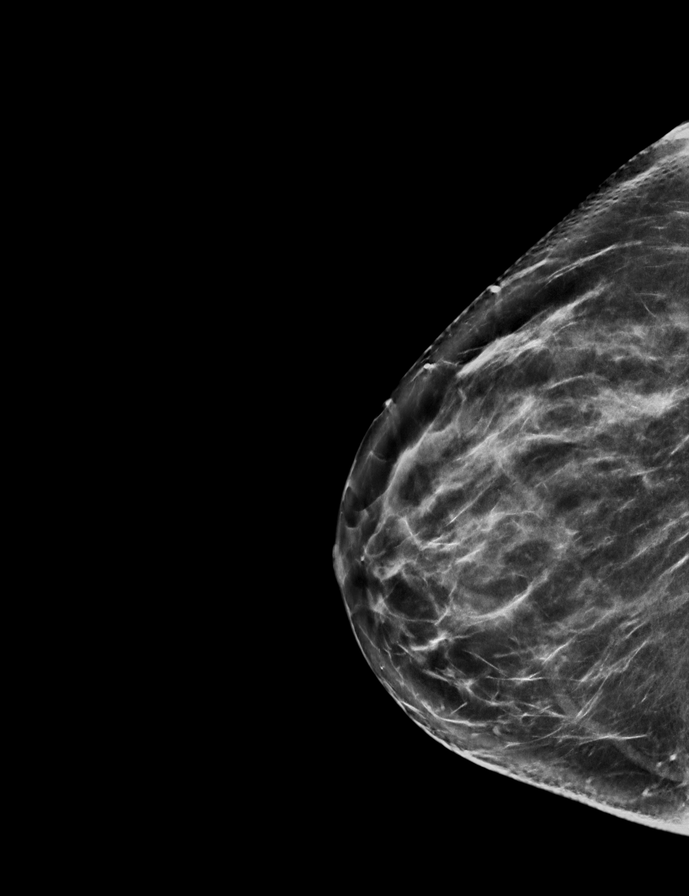

[L MLO synth-2D]
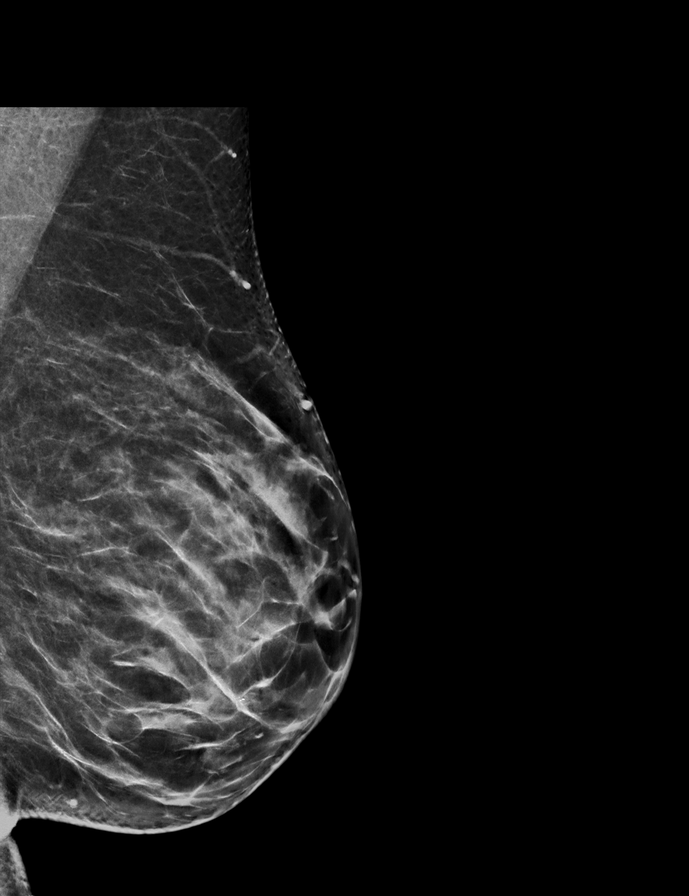

[9 of 30 positions shown; findings below may reference images not displayed]

ACR Breast Density Category c: The breast tissue is heterogeneously
dense, which may obscure small masses.
FINDINGS: There are no findings suspicious for malignancy. Images were
processed with CAD.
IMPRESSION: No mammographic evidence of malignancy. A result letter of this
screening mammogram will be mailed directly to the patient.

RECOMMENDATION:
Screening mammogram in one year. (Code:[id number])

BI-RADS CATEGORY  1: Negative.

## 2017-01-26 ENCOUNTER — Other Ambulatory Visit: Payer: Self-pay | Admitting: Family Medicine

## 2017-01-26 DIAGNOSIS — Z1239 Encounter for other screening for malignant neoplasm of breast: Secondary | ICD-10-CM

## 2017-01-30 NOTE — Progress Notes (Signed)
ANNUAL PREVENTATIVE CARE GYN  ENCOUNTER NOTE  Subjective:       Nancy Patterson is a 47 y.o. 282P2002 female here for a routine annual gynecologic exam.  Current complaints: 1.  None  Patient is monogamous.  She is getting married in May 2019.  She has been with her partner for 1-1/2 years. She has lost 17 pounds since October 2018 while going on the keto diet. Bowel function is normal.  Occasional mild constipation is responsive to fiber cubes. Bladder function is normal. No major no interval health issues.   Gynecologic History lmp- 01/26/2017  Contraception: tubal ligation Last Pap: 01/2015 neg/neg. 01/31/2016 neg/neg/neg  Results were: normal Last mammogram: 03/2016 birad 1. Results were: normal  Obstetric History OB History  Gravida Para Term Preterm AB Living  2 2 2     2   SAB TAB Ectopic Multiple Live Births          2    # Outcome Date GA Lbr Len/2nd Weight Sex Delivery Anes PTL Lv  2 Term      Vag-Spont   LIV  1 Term      Vag-Spont   LIV      Past Medical History:  Diagnosis Date  . Headache    migraine  . Hypothyroidism   . Increased BMI   . Menstrual headache   . PMDD (premenstrual dysphoric disorder)   . Vaginal Pap smear, abnormal    cervical dysplasia  . Vaginitis   . Vitamin D deficiency     Past Surgical History:  Procedure Laterality Date  . REFRACTIVE SURGERY    . TUBAL LIGATION      Current Outpatient Medications on File Prior to Visit  Medication Sig Dispense Refill  . atorvastatin (LIPITOR) 10 MG tablet     . citalopram (CELEXA) 40 MG tablet Take by mouth.    . clonazePAM (KLONOPIN) 1 MG tablet Take by mouth.    . [employer] FIBER GUMMIES PO Take by mouth.    . levothyroxine (SYNTHROID, LEVOTHROID) 25 MCG tablet     . Multiple Vitamin (MULTIVITAMIN) capsule Take 1 capsule by mouth daily.    Marland Kitchen. RA KRILL OIL 500 MG CAPS Take by mouth.    . SUMAtriptan (IMITREX) 100 MG tablet as needed.     No current facility-administered medications on file prior to  visit.     Allergies  Allergen Reactions  . Azithromycin     Social History   Socioeconomic History  . Marital status: Divorced    Spouse name: Not on file  . Number of children: Not on file  . Years of education: Not on file  . Highest education level: Not on file  Social Needs  . Financial resource strain: Not on file  . Food insecurity - worry: Not on file  . Food insecurity - inability: Not on file  . Transportation needs - medical: Not on file  . Transportation needs - non-medical: Not on file  Occupational History  . Not on file  Tobacco Use  . Smoking status: Former Smoker    Last attempt to quit: 2000    Years since quitting: 19.0  . Smokeless tobacco: Never Used  Substance and Sexual Activity  . Alcohol use: No  . Drug use: No  . Sexual activity: Yes    Birth control/protection: Surgical  Other Topics Concern  . Not on file  Social History Narrative  . Not on file    Family History  Problem Relation Age  of Onset  . Heart disease Father   . Colon cancer Paternal Uncle   . Hyperlipidemia Paternal Grandmother   . Diabetes Mother   . Breast cancer Neg Hx   . Ovarian cancer Neg Hx     The following portions of the patient's history were reviewed and updated as appropriate: allergies, current medications, past family history, past medical history, past social history, past surgical history and problem list.  Review of Systems Review of Systems  Constitutional: Negative.   HENT: Negative.   Eyes: Negative.   Respiratory: Negative.   Cardiovascular: Negative.   Gastrointestinal: Positive for constipation.  Genitourinary: Negative.   Musculoskeletal: Negative.   Skin: Negative.   Neurological: Negative.   Endo/Heme/Allergies: Negative.   Psychiatric/Behavioral: Negative.      Objective:   BP 103/70   Pulse 88   Ht 5\' 5"  (1.651 m)   Wt 172 lb 12.8 oz (78.4 kg)   LMP 01/26/2017 (Exact Date)   BMI 28.76 kg/m  CONSTITUTIONAL: Well-developed,  well-nourished female in no acute distress.  PSYCHIATRIC: Normal mood and affect. Normal behavior. Normal judgment and thought content. NEUROLGIC: Alert and oriented to person, place, and time. Normal muscle tone coordination. No cranial nerve deficit noted. HENT:  Normocephalic, atraumatic, External right and left ear normal. Oropharynx is clear and moist EYES: Conjunctivae and EOM are normal.. No scleral icterus.  NECK: Normal range of motion, supple, no masses.  Normal thyroid.  SKIN: Skin is warm and dry. No rash noted. Not diaphoretic. No erythema. No pallor. CARDIOVASCULAR: Normal heart rate noted, regular rhythm, no murmur. RESPIRATORY: Clear to auscultation bilaterally. Effort and breath sounds normal, no problems with respiration noted. BREASTS: Symmetric in size. No masses, skin changes, nipple drainage, or lymphadenopathy.  There is asymmetry in the chest wall with the right thorax being slightly more prominent than the left thorax. ABDOMEN: Soft, normal bowel sounds, no distention noted.  No tenderness, rebound or guarding.  BLADDER: Normal PELVIC:  External Genitalia: Normal  BUS: Normal  Vagina: Normal  Cervix: Normal; parous; no lesions; no cervical motion tenderness  Uterus: Normal; midplane to anteverted, normal size and shape, mobile, nontender  Adnexa: Normal; nonpalpable and nontender  RV: External Exam NormaI, No Rectal Masses and Normal Sphincter tone  MUSCULOSKELETAL: Normal range of motion. No tenderness.  No cyanosis, clubbing, or edema.  2+ distal pulses. LYMPHATIC: No Axillary, Supraclavicular, or Inguinal Adenopathy.    Assessment:   Annual gynecologic examination 47 y.o. Contraception: tubal ligation [id number] History of cervical dysplasia   Plan:  Pap: pap w/rflx per pt request with gc/ch Mammogram: Ordered Stool Guaiac Testing:  Not Indicated Labs: std testing Routine preventative health maintenance measures emphasized: Exercise/Diet/Weight control,  Tobacco Warnings, Alcohol/Substance use risks and Safe Sex Return to Clinic - 1 Year   [place], [place]  Herold Harms, MD  Note: This dictation was prepared with Dragon dictation along with smaller phrase technology. Any transcriptional errors that result from this process are unintentional.

## 2017-02-03 ENCOUNTER — Encounter: Payer: Self-pay | Admitting: Obstetrics and Gynecology

## 2017-02-03 ENCOUNTER — Ambulatory Visit (INDEPENDENT_AMBULATORY_CARE_PROVIDER_SITE_OTHER): Payer: BC Managed Care – PPO | Admitting: Obstetrics and Gynecology

## 2017-02-03 VITALS — BP 103/70 | HR 88 | Ht 65.0 in | Wt 172.8 lb

## 2017-02-03 DIAGNOSIS — Z1239 Encounter for other screening for malignant neoplasm of breast: Secondary | ICD-10-CM

## 2017-02-03 DIAGNOSIS — Z202 Contact with and (suspected) exposure to infections with a predominantly sexual mode of transmission: Secondary | ICD-10-CM

## 2017-02-03 DIAGNOSIS — E039 Hypothyroidism, unspecified: Secondary | ICD-10-CM

## 2017-02-03 DIAGNOSIS — E785 Hyperlipidemia, unspecified: Secondary | ICD-10-CM

## 2017-02-03 DIAGNOSIS — Z1231 Encounter for screening mammogram for malignant neoplasm of breast: Secondary | ICD-10-CM | POA: Diagnosis not present

## 2017-02-03 DIAGNOSIS — R638 Other symptoms and signs concerning food and fluid intake: Secondary | ICD-10-CM | POA: Diagnosis not present

## 2017-02-03 DIAGNOSIS — Z01419 Encounter for gynecological examination (general) (routine) without abnormal findings: Secondary | ICD-10-CM

## 2017-02-03 NOTE — Patient Instructions (Signed)
1.  Pap smear is done 2.  STD testing is performed 3.  Mammogram is ordered 4.  Continue with healthy eating and exercise and control weight loss 5.  Return in 1 year for annual exam  Health Maintenance, Female Adopting a healthy lifestyle and getting preventive care can go a long way to promote health and wellness. Talk with your health care provider about what schedule of regular examinations is right for you. This is a good chance for you to check in with your provider about disease prevention and staying healthy. In between checkups, there are plenty of things you can do on your own. Experts have done a lot of research about which lifestyle changes and preventive measures are most likely to keep you healthy. Ask your health care provider for more information. Weight and diet Eat a healthy diet  Be sure to include plenty of vegetables, fruits, low-fat dairy products, and lean protein.  Do not eat a lot of foods high in solid fats, added sugars, or salt.  Get regular exercise. This is one of the most important things you can do for your health. ? Most adults should exercise for at least 150 minutes each week. The exercise should increase your heart rate and make you sweat (moderate-intensity exercise). ? Most adults should also do strengthening exercises at least twice a week. This is in addition to the moderate-intensity exercise.  Maintain a healthy weight  Body mass index (BMI) is a measurement that can be used to identify possible weight problems. It estimates body fat based on height and weight. Your health care provider can help determine your BMI and help you achieve or maintain a healthy weight.  For females 65 years of age and older: ? A BMI below 18.5 is considered underweight. ? A BMI of 18.5 to 24.9 is normal. ? A BMI of 25 to 29.9 is considered overweight. ? A BMI of 30 and above is considered obese.  Watch levels of cholesterol and blood lipids  You should start having  your blood tested for lipids and cholesterol at 47 years of age, then have this test every 5 years.  You may need to have your cholesterol levels checked more often if: ? Your lipid or cholesterol levels are high. ? You are older than 47 years of age. ? You are at high risk for heart disease.  Cancer screening Lung Cancer  Lung cancer screening is recommended for adults 46-47 years old who are at high risk for lung cancer because of a history of smoking.  A yearly low-dose CT scan of the lungs is recommended for people who: ? Currently smoke. ? Have quit within the past 15 years. ? Have at least a 30-pack-year history of smoking. A pack year is smoking an average of one pack of cigarettes a day for 1 year.  Yearly screening should continue until it has been 15 years since you quit.  Yearly screening should stop if you develop a health problem that would prevent you from having lung cancer treatment.  Breast Cancer  Practice breast self-awareness. This means understanding how your breasts normally appear and feel.  It also means doing regular breast self-exams. Let your health care provider know about any changes, no matter how small.  If you are in your 20s or 30s, you should have a clinical breast exam (CBE) by a health care provider every 1-3 years as part of a regular health exam.  If you are 40 or older, have  a CBE every year. Also consider having a breast X-ray (mammogram) every year.  If you have a family history of breast cancer, talk to your health care provider about genetic screening.  If you are at high risk for breast cancer, talk to your health care provider about having an MRI and a mammogram every year.  Breast cancer gene (BRCA) assessment is recommended for women who have family members with BRCA-related cancers. BRCA-related cancers include: ? Breast. ? Ovarian. ? Tubal. ? Peritoneal cancers.  Results of the assessment will determine the need for genetic  counseling and BRCA1 and BRCA2 testing.  Cervical Cancer Your health care provider may recommend that you be screened regularly for cancer of the pelvic organs (ovaries, uterus, and vagina). This screening involves a pelvic examination, including checking for microscopic changes to the surface of your cervix (Pap test). You may be encouraged to have this screening done every 3 years, beginning at age 60.  For women ages 51-65, health care providers may recommend pelvic exams and Pap testing every 3 years, or they may recommend the Pap and pelvic exam, combined with testing for human papilloma virus (HPV), every 5 years. Some types of HPV increase your risk of cervical cancer. Testing for HPV may also be done on women of any age with unclear Pap test results.  Other health care providers may not recommend any screening for nonpregnant women who are considered low risk for pelvic cancer and who do not have symptoms. Ask your health care provider if a screening pelvic exam is right for you.  If you have had past treatment for cervical cancer or a condition that could lead to cancer, you need Pap tests and screening for cancer for at least 20 years after your treatment. If Pap tests have been discontinued, your risk factors (such as having a new sexual partner) need to be reassessed to determine if screening should resume. Some women have medical problems that increase the chance of getting cervical cancer. In these cases, your health care provider may recommend more frequent screening and Pap tests.  Colorectal Cancer  This type of cancer can be detected and often prevented.  Routine colorectal cancer screening usually begins at 47 years of age and continues through 47 years of age.  Your health care provider may recommend screening at an earlier age if you have risk factors for colon cancer.  Your health care provider may also recommend using home test kits to check for hidden blood in the  stool.  A small camera at the end of a tube can be used to examine your colon directly (sigmoidoscopy or colonoscopy). This is done to check for the earliest forms of colorectal cancer.  Routine screening usually begins at age 25.  Direct examination of the colon should be repeated every 5-10 years through 47 years of age. However, you may need to be screened more often if early forms of precancerous polyps or small growths are found.  Skin Cancer  Check your skin from head to toe regularly.  Tell your health care provider about any new moles or changes in moles, especially if there is a change in a mole's shape or color.  Also tell your health care provider if you have a mole that is larger than the size of a pencil eraser.  Always use sunscreen. Apply sunscreen liberally and repeatedly throughout the day.  Protect yourself by wearing long sleeves, pants, a wide-brimmed hat, and sunglasses whenever you are outside.  Heart  disease, diabetes, and high blood pressure  High blood pressure causes heart disease and increases the risk of stroke. High blood pressure is more likely to develop in: ? People who have blood pressure in the high end of the normal range (130-139/85-89 mm Hg). ? People who are overweight or obese. ? People who are African American.  If you are 86-35 years of age, have your blood pressure checked every 3-5 years. If you are 73 years of age or older, have your blood pressure checked every year. You should have your blood pressure measured twice-once when you are at a hospital or clinic, and once when you are not at a hospital or clinic. Record the average of the two measurements. To check your blood pressure when you are not at a hospital or clinic, you can use: ? An automated blood pressure machine at a pharmacy. ? A home blood pressure monitor.  If you are between 28 years and 37 years old, ask your health care provider if you should take aspirin to prevent  strokes.  Have regular diabetes screenings. This involves taking a blood sample to check your fasting blood sugar level. ? If you are at a normal weight and have a low risk for diabetes, have this test once every three years after 47 years of age. ? If you are overweight and have a high risk for diabetes, consider being tested at a younger age or more often. Preventing infection Hepatitis B  If you have a higher risk for hepatitis B, you should be screened for this virus. You are considered at high risk for hepatitis B if: ? You were born in a country where hepatitis B is common. Ask your health care provider which countries are considered high risk. ? Your parents were born in a high-risk country, and you have not been immunized against hepatitis B (hepatitis B vaccine). ? You have HIV or AIDS. ? You use needles to inject street drugs. ? You live with someone who has hepatitis B. ? You have had sex with someone who has hepatitis B. ? You get hemodialysis treatment. ? You take certain medicines for conditions, including cancer, organ transplantation, and autoimmune conditions.  Hepatitis C  Blood testing is recommended for: ? Everyone born from [birth year] through 1965. ? Anyone with known risk factors for hepatitis C.  Sexually transmitted infections (STIs)  You should be screened for sexually transmitted infections (STIs) including gonorrhea and chlamydia if: ? You are sexually active and are younger than 47 years of age. ? You are older than 47 years of age and your health care provider tells you that you are at risk for this type of infection. ? Your sexual activity has changed since you were last screened and you are at an increased risk for chlamydia or gonorrhea. Ask your health care provider if you are at risk.  If you do not have HIV, but are at risk, it may be recommended that you take a prescription medicine daily to prevent HIV infection. This is called pre-exposure prophylaxis  (PrEP). You are considered at risk if: ? You are sexually active and do not regularly use condoms or know the HIV status of your partner(s). ? You take drugs by injection. ? You are sexually active with a partner who has HIV.  Talk with your health care provider about whether you are at high risk of being infected with HIV. If you choose to begin PrEP, you should first be tested for HIV. You  should then be tested every 3 months for as long as you are taking PrEP. Pregnancy  If you are premenopausal and you may become pregnant, ask your health care provider about preconception counseling.  If you may become pregnant, take 400 to 800 micrograms (mcg) of folic acid every day.  If you want to prevent pregnancy, talk to your health care provider about birth control (contraception). Osteoporosis and menopause  Osteoporosis is a disease in which the bones lose minerals and strength with aging. This can result in serious bone fractures. Your risk for osteoporosis can be identified using a bone density scan.  If you are 69 years of age or older, or if you are at risk for osteoporosis and fractures, ask your health care provider if you should be screened.  Ask your health care provider whether you should take a calcium or vitamin D supplement to lower your risk for osteoporosis.  Menopause may have certain physical symptoms and risks.  Hormone replacement therapy may reduce some of these symptoms and risks. Talk to your health care provider about whether hormone replacement therapy is right for you. Follow these instructions at home:  Schedule regular health, dental, and eye exams.  Stay current with your immunizations.  Do not use any tobacco products including cigarettes, chewing tobacco, or electronic cigarettes.  If you are pregnant, do not drink alcohol.  If you are breastfeeding, limit how much and how often you drink alcohol.  Limit alcohol intake to no more than 1 drink per day for  nonpregnant women. One drink equals 12 ounces of beer, 5 ounces of wine, or 1 ounces of hard liquor.  Do not use street drugs.  Do not share needles.  Ask your health care provider for help if you need support or information about quitting drugs.  Tell your health care provider if you often feel depressed.  Tell your health care provider if you have ever been abused or do not feel safe at home. This information is not intended to replace advice given to you by your health care provider. Make sure you discuss any questions you have with your health care provider. Document Released: 07/22/2010 Document Revised: 06/14/2015 Document Reviewed: 10/10/2014 Elsevier Interactive Patient Education  [employer].

## 2017-02-04 LAB — HSV(HERPES SIMPLEX VRS) I + II AB-IGG
HSV 1 Glycoprotein G Ab, IgG: 6.32 index — ABNORMAL HIGH (ref 0.00–0.90)
HSV 2 IgG, Type Spec: 14.8 index — ABNORMAL HIGH (ref 0.00–0.90)

## 2017-02-04 LAB — HEPATITIS B SURFACE ANTIGEN: Hepatitis B Surface Ag: NEGATIVE

## 2017-02-04 LAB — HIV ANTIBODY (ROUTINE TESTING W REFLEX): HIV Screen 4th Generation wRfx: NONREACTIVE

## 2017-02-04 LAB — RPR: RPR: NONREACTIVE

## 2017-02-04 LAB — HEPATITIS C ANTIBODY: Hep C Virus Ab: <0.1 s/co ratio (ref 0.0–0.9)

## 2017-02-05 LAB — PAP IG, CT-NG, RFX HPV ASCU
CHLAMYDIA, NUC. ACID AMP: NEGATIVE
Gonococcus by Nucleic Acid Amp: NEGATIVE
PAP SMEAR COMMENT: 0

## 2017-02-25 ENCOUNTER — Ambulatory Visit
Admission: RE | Admit: 2017-02-25 | Discharge: 2017-02-25 | Disposition: A | Discharge status: Home / Self Care | Payer: BC Managed Care – PPO | Source: Ambulatory Visit | Attending: Family Medicine | Admitting: Family Medicine

## 2017-02-25 DIAGNOSIS — E041 Nontoxic single thyroid nodule: Secondary | ICD-10-CM | POA: Insufficient documentation

## 2017-02-25 DIAGNOSIS — E782 Mixed hyperlipidemia: Secondary | ICD-10-CM | POA: Insufficient documentation

## 2017-04-17 ENCOUNTER — Ambulatory Visit
Admission: RE | Admit: 2017-04-17 | Discharge: 2017-04-17 | Disposition: A | Discharge status: Home / Self Care | Payer: BC Managed Care – PPO | Source: Ambulatory Visit | Attending: Obstetrics and Gynecology | Admitting: Obstetrics and Gynecology

## 2017-04-17 DIAGNOSIS — Z1231 Encounter for screening mammogram for malignant neoplasm of breast: Secondary | ICD-10-CM | POA: Insufficient documentation

## 2017-04-17 DIAGNOSIS — Z1239 Encounter for other screening for malignant neoplasm of breast: Secondary | ICD-10-CM

## 2018-02-04 ENCOUNTER — Encounter: Payer: BC Managed Care – PPO | Admitting: Obstetrics and Gynecology

## 2018-02-04 ENCOUNTER — Encounter: Payer: Self-pay | Admitting: Obstetrics and Gynecology

## 2018-02-04 ENCOUNTER — Ambulatory Visit (INDEPENDENT_AMBULATORY_CARE_PROVIDER_SITE_OTHER): Payer: BC Managed Care – PPO | Admitting: Obstetrics and Gynecology

## 2018-02-04 VITALS — BP 92/62 | HR 81 | Ht 65.0 in | Wt 159.4 lb

## 2018-02-04 DIAGNOSIS — E785 Hyperlipidemia, unspecified: Secondary | ICD-10-CM | POA: Diagnosis not present

## 2018-02-04 DIAGNOSIS — E039 Hypothyroidism, unspecified: Secondary | ICD-10-CM | POA: Diagnosis not present

## 2018-02-04 DIAGNOSIS — Z01419 Encounter for gynecological examination (general) (routine) without abnormal findings: Secondary | ICD-10-CM

## 2018-02-04 NOTE — Progress Notes (Signed)
GYNECOLOGY ANNUAL PHYSICAL EXAM PROGRESS NOTE  Subjective:    Nancy Patterson is a 48 y.o. 492P2002 female who presents for an annual exam. She is transitioning care from Dr. Greggory Keenefrancesco who has retired. The patient has no complaints today. The patient is sexually active.  The patient wears seatbelts: yes. The patient participates in regular exercise: no. Has the patient ever been transfused or tattooed?: no. The patient reports that there is not domestic violence in her life.   Patient desired to inform provider that she has been on the keto-diet for almost 1 year, and has noted significant results with her weight and overall health.   Gynecologic History Menarche age: [age] Patient's last menstrual period was 01/14/2018. Contraception: tubal ligation History of STI's: Denies Last Pap: 02/03/2017. Results were: normal.  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 Last mammogram: 03/2917. Results were: normal [facility]  OB History  Gravida Para Term Preterm AB Living  2 2 2  0 0 2  SAB TAB Ectopic Multiple Live Births  0 0 0 0 2    # Outcome Date GA Lbr Len/2nd Weight Sex Delivery Anes PTL Lv  2 Term      Vag-Spont   LIV  1 Term      Vag-Spont   LIV    Past Medical History:  Diagnosis Date  . Dyslipidemia   . Headache    migraine  . Hypothyroidism   . Increased BMI   . Menstrual headache   . PMDD (premenstrual dysphoric disorder)   . Vaginal Pap smear, abnormal    cervical dysplasia  . Vaginitis   . Vitamin D deficiency     Past Surgical History:  Procedure Laterality Date  . REFRACTIVE SURGERY    . TUBAL LIGATION      Family History  Problem Relation Age of Onset  . Heart disease Father   . Diabetes Father   . Colon cancer Paternal Uncle   . Hyperlipidemia Paternal Grandmother   . Diabetes Mother   . Multiple sclerosis Mother   . Breast cancer Neg Hx   . Ovarian cancer Neg Hx     Social History   Socioeconomic History  . Marital  status: Significant Other    Spouse name: Not on file  . Number of children: Not on file  . Years of education: Not on file  . Highest education level: Not on file  Occupational History  . Not on file  Social Needs  . Financial resource strain: Not on file  . Food insecurity:    Worry: Not on file    Inability: Not on file  . Transportation needs:    Medical: Not on file    Non-medical: Not on file  Tobacco Use  . Smoking status: Former Smoker    Last attempt to quit: 2000    Years since quitting: 20.0  . Smokeless tobacco: Never Used  Substance and Sexual Activity  . Alcohol use: No  . Drug use: No  . Sexual activity: Yes    Birth control/protection: Surgical  Lifestyle  . Physical activity:    Days per week: 0 days    Minutes per session: 0 min  . Stress: Not on file  Relationships  . Social connections:    Talks on phone: Not on file    Gets together: Not on file    Attends religious service: Not on file    Active member of club or organization:  Not on file    Attends meetings of clubs or organizations: Not on file    Relationship status: Not on file  . Intimate partner violence:    Fear of current or ex partner: Not on file    Emotionally abused: Not on file    Physically abused: Not on file    Forced sexual activity: Not on file  Other Topics Concern  . Not on file  Social History Narrative  . Not on file    Current Outpatient Medications on File Prior to Visit  Medication Sig Dispense Refill  . celecoxib (CELEBREX) 100 MG capsule Take 100 mg by mouth 2 (two) times daily.    . Cholecalciferol (VITAMIN D3) 50 MCG (2000 UT) capsule Take 2 capsules by mouth daily.    . citalopram (CELEXA) 40 MG tablet Take 40 mg by mouth daily.    Marland Kitchen co-enzyme Q-10 30 MG capsule Take 30 mg by mouth 3 (three) times daily.    . [employer] FIBER GUMMIES PO Take by mouth.    . Cyanocobalamin 2500 MCG SUBL     . cyclobenzaprine (FLEXERIL) 10 MG tablet Take 10 mg by mouth at bedtime as  needed.    Marland Kitchen levothyroxine (SYNTHROID, LEVOTHROID) 50 MCG tablet Take 50 mcg by mouth daily before breakfast.    . Multiple Vitamin (MULTIVITAMIN) capsule Take 1 capsule by mouth daily.    Marland Kitchen RA KRILL OIL 500 MG CAPS Take by mouth.    . rizatriptan (MAXALT-MLT) 10 MG disintegrating tablet Take 10 mg by mouth 2 (two) times daily as needed.    . rosuvastatin (CRESTOR) 5 MG tablet Take 5 mg by mouth daily.    . SUMAtriptan (IMITREX) 100 MG tablet as needed.     No current facility-administered medications on file prior to visit.     Allergies  Allergen Reactions  . Azithromycin      Review of Systems Constitutional: negative for chills, fatigue, fevers and sweats Eyes: negative for irritation, redness and visual disturbance Ears, nose, mouth, throat, and face: negative for hearing loss, nasal congestion, snoring and tinnitus Respiratory: negative for asthma, cough, sputum Cardiovascular: negative for chest pain, dyspnea, exertional chest pressure/discomfort, irregular heart beat, palpitations and syncope Gastrointestinal: negative for abdominal pain, change in bowel habits, nausea and vomiting Genitourinary: negative for abnormal menstrual periods, genital lesions, sexual problems and vaginal discharge, dysuria and urinary incontinence Integument/breast: negative for breast lump, breast tenderness and nipple discharge Hematologic/lymphatic: negative for bleeding and easy bruising Musculoskeletal:negative for back pain and muscle weakness Neurological: negative for dizziness, headaches, vertigo and weakness Endocrine: negative for diabetic symptoms including polydipsia, polyuria and skin dryness Allergic/Immunologic: negative for hay fever and urticaria       Objective:  Blood pressure 92/62, pulse 81, height 5\' 5"  (1.651 m), weight 159 lb 6.4 oz (72.3 kg), last menstrual period 01/14/2018. Body mass index is 26.53 kg/m.  General Appearance:    Alert, cooperative, no distress, appears  stated age  Head:    Normocephalic, without obvious abnormality, atraumatic  Eyes:    PERRL, conjunctiva/corneas clear, EOM's intact, both eyes  Ears:    Normal external ear canals, both ears  Nose:   Nares normal, septum midline, mucosa normal, no drainage or sinus tenderness  Throat:   Lips, mucosa, and tongue normal; teeth and gums normal  Neck:   Supple, symmetrical, trachea midline, no adenopathy; thyroid: no enlargement/tenderness/nodules; no carotid bruit or JVD  Back:     Symmetric, no curvature, ROM normal, no  CVA tenderness  Lungs:     Clear to auscultation bilaterally, respirations unlabored  Chest Wall:    No tenderness or deformity   Heart:    Regular rate and rhythm, S1 and S2 normal, no murmur, rub or gallop  Breast Exam:    No tenderness, masses, or nipple abnormality  Abdomen:     Soft, non-tender, bowel sounds active all four quadrants, no masses, no organomegaly.    Genitalia:    Pelvic:external genitalia normal, vagina without lesions, discharge, or tenderness, rectovaginal septum  normal. Cervix normal in appearance, no cervical motion tenderness, no adnexal masses or tenderness.  Uterus normal size, shape, mobile, regular contours, nontender.  Rectal:    Normal external sphincter.  No hemorrhoids appreciated. Internal exam not done.   Extremities:   Extremities normal, atraumatic, no cyanosis or edema  Pulses:   2+ and symmetric all extremities  Skin:   Skin color, texture, turgor normal, no rashes or lesions  Lymph nodes:   Cervical, supraclavicular, and axillary nodes normal  Neurologic:   CNII-XII intact, normal strength, sensation and reflexes throughout   .  Labs:  Reviewed labs from Care Everywhere (08/2017)  Assessment:   Healthy female exam.  Dyslipidemia Hypothyroidism  Plan:     Blood tests: none ordered, up to date. Followed by PCP. Breast self exam technique reviewed and patient encouraged to perform self-exam monthly. Contraception: tubal  ligation. Discussed healthy lifestyle modifications. Mammogram ordered. Pap smear up to date. Continue q 3 year screens Up to date with flu vaccine. Dyslipdemia and Hypothyroidism managed by PCP.   Follow up in 1 year      Hildred Laserherry, Holland Nickson, MD [facility]

## 2018-02-04 NOTE — Progress Notes (Signed)
PT is present today for her annual exam. Pt stated that she has not been doing self-breast exams monthly. Pt stated that she is doing well and denies any issues. No problems or concerns.  Pt had flu vaccine on Sept 5, 2019 at [facility].

## 2018-02-04 NOTE — Patient Instructions (Signed)

## 2018-03-10 ENCOUNTER — Other Ambulatory Visit: Payer: Self-pay | Admitting: Obstetrics and Gynecology

## 2018-03-10 DIAGNOSIS — Z1231 Encounter for screening mammogram for malignant neoplasm of breast: Secondary | ICD-10-CM

## 2018-09-23 ENCOUNTER — Other Ambulatory Visit: Payer: Self-pay | Admitting: Family Medicine

## 2018-09-23 DIAGNOSIS — Z1231 Encounter for screening mammogram for malignant neoplasm of breast: Secondary | ICD-10-CM

## 2018-11-09 ENCOUNTER — Ambulatory Visit
Admission: RE | Admit: 2018-11-09 | Discharge: 2018-11-09 | Disposition: A | Discharge status: Home / Self Care | Payer: BC Managed Care – PPO | Source: Ambulatory Visit | Attending: Family Medicine | Admitting: Family Medicine

## 2018-11-09 DIAGNOSIS — Z1231 Encounter for screening mammogram for malignant neoplasm of breast: Secondary | ICD-10-CM | POA: Insufficient documentation

## 2019-02-08 ENCOUNTER — Encounter: Payer: BC Managed Care – PPO | Admitting: Obstetrics and Gynecology

## 2019-05-16 NOTE — Patient Instructions (Addendum)
Preventive Care 41-49 Years Old, Female Preventive care refers to visits with your health care provider and lifestyle choices that can promote health and wellness. This includes:  A yearly physical exam. This may also be called an annual well check.  Regular dental visits and eye exams.  Immunizations.  Screening for certain conditions.  Healthy lifestyle choices, such as eating a healthy diet, getting regular exercise, not using drugs or products that contain nicotine and tobacco, and limiting alcohol use. What can I expect for my preventive care visit? Physical exam Your health care provider will check your:  Height and weight. This may be used to calculate body mass index (BMI), which tells if you are at a healthy weight.  Heart rate and blood pressure.  Skin for abnormal spots. Counseling Your health care provider may ask you questions about your:  Alcohol, tobacco, and drug use.  Emotional well-being.  Home and relationship well-being.  Sexual activity.  Eating habits.  Work and work Statistician.  Method of birth control.  Menstrual cycle.  Pregnancy history. What immunizations do I need?  Influenza (flu) vaccine  This is recommended every year. Tetanus, diphtheria, and pertussis (Tdap) vaccine  You may need a Td booster every 10 years. Varicella (chickenpox) vaccine  You may need this if you have not been vaccinated. Zoster (shingles) vaccine  You may need this after age 61. Measles, mumps, and rubella (MMR) vaccine  You may need at least one dose of MMR if you were born in 1957 or later. You may also need a second dose. Pneumococcal conjugate (PCV13) vaccine  You may need this if you have certain conditions and were not previously vaccinated. Pneumococcal polysaccharide (PPSV23) vaccine  You may need one or two doses if you smoke cigarettes or if you have certain conditions. Meningococcal conjugate (MenACWY) vaccine  You may need this if you  have certain conditions. Hepatitis A vaccine  You may need this if you have certain conditions or if you travel or work in places where you may be exposed to hepatitis A. Hepatitis B vaccine  You may need this if you have certain conditions or if you travel or work in places where you may be exposed to hepatitis B. Haemophilus influenzae type b (Hib) vaccine  You may need this if you have certain conditions. Human papillomavirus (HPV) vaccine  If recommended by your health care provider, you may need three doses over 6 months. You may receive vaccines as individual doses or as more than one vaccine together in one shot (combination vaccines). Talk with your health care provider about the risks and benefits of combination vaccines. What tests do I need? Blood tests  Lipid and cholesterol levels. These may be checked every 5 years, or more frequently if you are over 65 years old.  Hepatitis C test.  Hepatitis B test. Screening  Lung cancer screening. You may have this screening every year starting at age 55 if you have a 30-pack-year history of smoking and currently smoke or have quit within the past 15 years.  Colorectal cancer screening. All adults should have this screening starting at age 59 and continuing until age 90. Your health care provider may recommend screening at age 66 if you are at increased risk. You will have tests every 1-10 years, depending on your results and the type of screening test.  Diabetes screening. This is done by checking your blood sugar (glucose) after you have not eaten for a while (fasting). You may have this  done every 1-3 years.  Mammogram. This may be done every 1-2 years. Talk with your health care provider about when you should start having regular mammograms. This may depend on whether you have a family history of breast cancer.  BRCA-related cancer screening. This may be done if you have a family history of breast, ovarian, tubal, or peritoneal  cancers.  Pelvic exam and Pap test. This may be done every 3 years starting at age 66. Starting at age 44, this may be done every 5 years if you have a Pap test in combination with an HPV test. Other tests  Sexually transmitted disease (STD) testing.  Bone density scan. This is done to screen for osteoporosis. You may have this scan if you are at high risk for osteoporosis. Follow these instructions at home: Eating and drinking  Eat a diet that includes fresh fruits and vegetables, whole grains, lean protein, and low-fat dairy.  Take vitamin and mineral supplements as recommended by your health care provider.  Do not drink alcohol if: ? Your health care provider tells you not to drink. ? You are pregnant, may be pregnant, or are planning to become pregnant.  If you drink alcohol: ? Limit how much you have to 0-1 drink a day. ? Be aware of how much alcohol is in your drink. In the [place] one drink equals one 12 oz bottle of beer (355 mL), one 5 oz glass of wine (148 mL), or one 1 oz glass of hard liquor (44 mL). Lifestyle  Take daily care of your teeth and gums.  Stay active. Exercise for at least 30 minutes on 5 or more days each week.  Do not use any products that contain nicotine or tobacco, such as cigarettes, e-cigarettes, and chewing tobacco. If you need help quitting, ask your health care provider.  If you are sexually active, practice safe sex. Use a condom or other form of birth control (contraception) in order to prevent pregnancy and STIs (sexually transmitted infections).  If told by your health care provider, take low-dose aspirin daily starting at age 37. What's next?  Visit your health care provider once a year for a well check visit.  Ask your health care provider how often you should have your eyes and teeth checked.  Stay up to date on all vaccines. This information is not intended to replace advice given to you by your health care provider. Make sure you  discuss any questions you have with your health care provider. Document Revised: 09/17/2017 Document Reviewed: 09/17/2017 Elsevier Patient Education  2020 [place] Breast self-awareness is knowing how your breasts look and feel. Doing breast self-awareness is important. It allows you to catch a breast problem early while it is still small and can be treated. All women should do breast self-awareness, including women who have had breast implants. Tell your doctor if you notice a change in your breasts. What you need:  A mirror.  A well-lit room. How to do a breast self-exam A breast self-exam is one way to learn what is normal for your breasts and to check for changes. To do a breast self-exam: Look for changes  1. Take off all the clothes above your waist. 2. Stand in front of a mirror in a room with good lighting. 3. Put your hands on your hips. 4. Push your hands down. 5. Look at your breasts and nipples in the mirror to see if one breast or nipple looks different from  the other. Check to see if: ? The shape of one breast is different. ? The size of one breast is different. ? There are wrinkles, dips, and bumps in one breast and not the other. 6. Look at each breast for changes in the skin, such as: ? Redness. ? Scaly areas. 7. Look for changes in your nipples, such as: ? Liquid around the nipples. ? Bleeding. ? Dimpling. ? Redness. ? A change in where the nipples are. Feel for changes  1. Lie on your back on the floor. 2. Feel each breast. To do this, follow these steps: ? Pick a breast to feel. ? Put the arm closest to that breast above your head. ? Use your other arm to feel the nipple area of your breast. Feel the area with the pads of your three middle fingers by making small circles with your fingers. For the first circle, press lightly. For the second circle, press harder. For the third circle, press even harder. ? Keep making circles with  your fingers at the different pressures as you move down your breast. Stop when you feel your ribs. ? Move your fingers a little toward the center of your body. ? Start making circles with your fingers again, this time going up until you reach your collarbone. ? Keep making up-and-down circles until you reach your armpit. Remember to keep using the three pressures. ? Feel the other breast in the same way. 3. Sit or stand in the tub or shower. 4. With soapy water on your skin, feel each breast the same way you did in step 2 when you were lying on the floor. Write down what you find Writing down what you find can help you remember what to tell your doctor. Write down:  What is normal for each breast.  Any changes you find in each breast, including: ? The kind of changes you find. ? Whether you have pain. ? Size and location of any lumps.  When you last had your menstrual period. General tips  Check your breasts every month.  If you are breastfeeding, the best time to check your breasts is after you feed your baby or after you use a breast pump.  If you get menstrual periods, the best time to check your breasts is 5-7 days after your menstrual period is over.  With time, you will become comfortable with the self-exam, and you will begin to know if there are changes in your breasts. Contact a doctor if you:  See a change in the shape or size of your breasts or nipples.  See a change in the skin of your breast or nipples, such as red or scaly skin.  Have fluid coming from your nipples that is not normal.  Find a lump or thick area that was not there before.  Have pain in your breasts.  Have any concerns about your breast health. Summary  Breast self-awareness includes looking for changes in your breasts, as well as feeling for changes within your breasts.  Breast self-awareness should be done in front of a mirror in a well-lit room.  You should check your breasts every month.  If you get menstrual periods, the best time to check your breasts is 5-7 days after your menstrual period is over.  Let your doctor know of any changes you see in your breasts, including changes in size, changes on the skin, pain or tenderness, or fluid from your nipples that is not normal. This information is  not intended to replace advice given to you by your health care provider. Make sure you discuss any questions you have with your health care provider. Document Revised: 08/25/2017 Document Reviewed: 08/25/2017 Elsevier Patient Education  [place] is the normal time of life before and after menstrual periods stop completely (menopause). Perimenopause can begin 2-8 years before menopause, and it usually lasts for 1 year after menopause. During perimenopause, the ovaries may or may not produce an egg. What are the causes? This condition is caused by a natural change in hormone levels that happens as you get older. What increases the risk? This condition is more likely to start at an earlier age if you have certain medical conditions or treatments, including:  A tumor of the pituitary gland in the brain.  A disease that affects the ovaries and hormone production.  Radiation treatment for cancer.  Certain cancer treatments, such as chemotherapy or hormone (anti-estrogen) therapy.  Heavy smoking and excessive alcohol use.  Family history of early menopause. What are the signs or symptoms? Perimenopausal changes affect each woman differently. Symptoms of this condition may include:  Hot flashes.  Night sweats.  Irregular menstrual periods.  Decreased sex drive.  Vaginal dryness.  Headaches.  Mood swings.  Depression.  Memory problems or trouble concentrating.  Irritability.  Tiredness.  Weight gain.  Anxiety.  Trouble getting pregnant. How is this diagnosed? This condition is diagnosed based on your medical history,  a physical exam, your age, your menstrual history, and your symptoms. Hormone tests may also be done. How is this treated? In some cases, no treatment is needed. You and your health care provider should make a decision together about whether treatment is necessary. Treatment will be based on your individual condition and preferences. Various treatments are available, such as:  Menopausal hormone therapy (MHT).  Medicines to treat specific symptoms.  Acupuncture.  Vitamin or herbal supplements. Before starting treatment, make sure to let your health care provider know if you have a personal or family history of:  Heart disease.  Breast cancer.  Blood clots.  Diabetes.  Osteoporosis. Follow these instructions at home: Lifestyle  Do not use any products that contain nicotine or tobacco, such as cigarettes and e-cigarettes. If you need help quitting, ask your health care provider.  Eat a balanced diet that includes fresh fruits and vegetables, whole grains, soybeans, eggs, lean meat, and low-fat dairy.  Get at least 30 minutes of physical activity on 5 or more days each week.  Avoid alcoholic and caffeinated beverages, as well as spicy foods. This may help prevent hot flashes.  Get 7-8 hours of sleep each night.  Dress in layers that can be removed to help you manage hot flashes.  Find ways to manage stress, such as deep breathing, meditation, or journaling. General instructions  Keep track of your menstrual periods, including: ? When they occur. ? How heavy they are and how long they last. ? How much time passes between periods.  Keep track of your symptoms, noting when they start, how often you have them, and how long they last.  Take over-the-counter and prescription medicines only as told by your health care provider.  Take vitamin supplements only as told by your health care provider. These may include calcium, vitamin E, and vitamin D.  Use vaginal lubricants or  moisturizers to help with vaginal dryness and improve comfort during sex.  Talk with your health care provider before starting any  herbal supplements.  Keep all follow-up visits as told by your health care provider. This is important. This includes any group therapy or counseling. Contact a health care provider if:  You have heavy vaginal bleeding or pass blood clots.  Your period lasts more than 2 days longer than normal.  Your periods are recurring sooner than 21 days.  You bleed after having sex. Get help right away if:  You have chest pain, trouble breathing, or trouble talking.  You have severe depression.  You have pain when you urinate.  You have severe headaches.  You have vision problems. Summary  Perimenopause is the time when a woman's body begins to move into menopause. This may happen naturally or as a result of other health problems or medical treatments.  Perimenopause can begin 2-8 years before menopause, and it usually lasts for 1 year after menopause.  Perimenopausal symptoms can be managed through medicines, lifestyle changes, and complementary therapies such as acupuncture. This information is not intended to replace advice given to you by your health care provider. Make sure you discuss any questions you have with your health care provider. Document Revised: 12/19/2016 Document Reviewed: 02/12/2016 Elsevier Patient Education  2020 [employer].

## 2019-05-16 NOTE — Progress Notes (Signed)
Pt present for annual exam. Pt c/o of increase in anxiety during her cycles and noticed her cycles are starting to become heavy. GAD-7=13. Pap smear up to date.

## 2019-05-17 ENCOUNTER — Encounter: Payer: Self-pay | Admitting: Obstetrics and Gynecology

## 2019-05-17 ENCOUNTER — Ambulatory Visit (INDEPENDENT_AMBULATORY_CARE_PROVIDER_SITE_OTHER): Payer: BC Managed Care – PPO | Admitting: Obstetrics and Gynecology

## 2019-05-17 ENCOUNTER — Other Ambulatory Visit: Payer: Self-pay

## 2019-05-17 VITALS — BP 90/64 | HR 76 | Ht 65.0 in | Wt 165.6 lb

## 2019-05-17 DIAGNOSIS — Z131 Encounter for screening for diabetes mellitus: Secondary | ICD-10-CM

## 2019-05-17 DIAGNOSIS — N924 Excessive bleeding in the premenopausal period: Secondary | ICD-10-CM

## 2019-05-17 DIAGNOSIS — F419 Anxiety disorder, unspecified: Secondary | ICD-10-CM

## 2019-05-17 DIAGNOSIS — Z01419 Encounter for gynecological examination (general) (routine) without abnormal findings: Secondary | ICD-10-CM

## 2019-05-17 DIAGNOSIS — E785 Hyperlipidemia, unspecified: Secondary | ICD-10-CM

## 2019-05-17 DIAGNOSIS — E039 Hypothyroidism, unspecified: Secondary | ICD-10-CM

## 2019-05-17 DIAGNOSIS — Z1231 Encounter for screening mammogram for malignant neoplasm of breast: Secondary | ICD-10-CM | POA: Diagnosis not present

## 2019-05-17 NOTE — Progress Notes (Signed)
GYNECOLOGY ANNUAL PHYSICAL EXAM PROGRESS NOTE  Subjective:    Nancy Patterson is a 49 y.o. G45P2002 female who presents for an annual exam.  The patient is sexually active.  The patient wears seatbelts: yes. The patient participates in regular exercise: no. Has the patient ever been transfused or tattooed?: no. The patient reports that there is not domestic violence in her life.    The patient has the following complaints today.  1. Nancy Patterson reports an increase in anxiety around the time of her menstrual cycle over the past several months.  She has a past history of anxiety which has been well-managed in the past on her Celexa. Her migraines have also become more frequent during her menses (used to occur prior to her menses).  2. She also complains of heavier cycles over the past few months.  She states the first few days are heavier, with passage of clots (using more tampons, approximately 3 more per day during the first few days of her cycle). Cycle is now also lasting 6-7 days, previoulsy 5 days.    Gynecologic History Menarche age: 62 Patient's last menstrual period was 04/25/2019. Contraception: tubal ligation History of STI's: Denies Last Pap: 02/03/2017. Results were: normal.  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 Last mammogram: 11/09/2018. Results were: normal   OB History  Gravida Para Term Preterm AB Living  2 2 2  0 0 2  SAB TAB Ectopic Multiple Live Births  0 0 0 0 2    # Outcome Date GA Lbr Len/2nd Weight Sex Delivery Anes PTL Lv  2 Term      Vag-Spont   LIV  1 Term      Vag-Spont   LIV    Past Medical History:  Diagnosis Date  . Dyslipidemia   . Headache    migraine  . Hypothyroidism   . Increased BMI   . Menstrual headache   . PMDD (premenstrual dysphoric disorder)   . Vaginal Pap smear, abnormal    cervical dysplasia  . Vaginitis   . Vitamin D deficiency     Past Surgical History:  Procedure Laterality Date  . REFRACTIVE SURGERY    . TUBAL  LIGATION      Family History  Problem Relation Age of Onset  . Heart disease Father   . Diabetes Father   . Colon cancer Paternal Uncle   . Hyperlipidemia Paternal Grandmother   . Diabetes Mother   . Multiple sclerosis Mother   . Breast cancer Neg Hx   . Ovarian cancer Neg Hx     Social History   Socioeconomic History  . Marital status: Significant Other    Spouse name: Not on file  . Number of children: Not on file  . Years of education: Not on file  . Highest education level: Not on file  Occupational History  . Not on file  Tobacco Use  . Smoking status: Former Smoker    Quit date: 2000    Years since quitting: 21.3  . Smokeless tobacco: Never Used  Substance and Sexual Activity  . Alcohol use: No  . Drug use: No  . Sexual activity: Yes    Birth control/protection: Surgical  Other Topics Concern  . Not on file  Social History Narrative  . Not on file   Social Determinants of Health   Financial Resource Strain:   . Difficulty of Paying Living Expenses:   Food Insecurity:   . Worried About  of Food in the Last Year:   . Ran Out of Food in the Last Year:   Transportation Needs:   . Lack of Transportation (Medical):   Marland Kitchen Lack of Transportation (Non-Medical):   Physical Activity:   . Days of Exercise per Week:   . Minutes of Exercise per Session:   Stress:   . Feeling of Stress :   Social Connections:   . Frequency of Communication with Friends and Family:   . Frequency of Social Gatherings with Friends and Family:   . Attends Religious Services:   . Active Member of Clubs or Organizations:   . Attends Banker Meetings:   Marland Kitchen Marital Status:   Intimate Partner Violence:   . Fear of Current or Ex-Partner:   . Emotionally Abused:   Marland Kitchen Physically Abused:   . Sexually Abused:     Current Outpatient Medications on File Prior to Visit  Medication Sig Dispense Refill  . citalopram (CELEXA) 40 MG tablet Take 40 mg by mouth daily.    Marland Kitchen  co-enzyme Q-10 30 MG capsule Take 30 mg by mouth 3 (three) times daily.    . fexofenadine (ALLEGRA) 60 MG tablet Take 60 mg by mouth 2 (two) times daily.    Marland Kitchen levothyroxine (SYNTHROID, LEVOTHROID) 50 MCG tablet Take 50 mcg by mouth daily before breakfast.    . Multiple Vitamin (MULTIVITAMIN) capsule Take 1 capsule by mouth daily.    Marland Kitchen RA KRILL OIL 500 MG CAPS Take by mouth.    . rizatriptan (MAXALT-MLT) 10 MG disintegrating tablet Take 10 mg by mouth 2 (two) times daily as needed.    . rosuvastatin (CRESTOR) 5 MG tablet Take 5 mg by mouth daily.    . SUMAtriptan (IMITREX) 100 MG tablet as needed.    Marland Kitchen VITAMIN D PO Take 2,000 mg by mouth.     No current facility-administered medications on file prior to visit.    Allergies  Allergen Reactions  . Azithromycin      Review of Systems Constitutional: negative for chills, fatigue, fevers and sweats Eyes: negative for irritation, redness and visual disturbance Ears, nose, mouth, throat, and face: negative for hearing loss, nasal congestion, snoring and tinnitus Respiratory: negative for asthma, cough, sputum Cardiovascular: negative for chest pain, dyspnea, exertional chest pressure/discomfort, irregular heart beat, palpitations and syncope Gastrointestinal: negative for abdominal pain, change in bowel habits, nausea and vomiting Genitourinary: positive for abnormal menstrual periods (see HPI).  Negative for genital lesions, sexual problems and vaginal discharge, dysuria and urinary incontinence Integument/breast: negative for breast lump, breast tenderness and nipple discharge Hematologic/lymphatic: negative for bleeding and easy bruising Musculoskeletal:negative for back pain and muscle weakness Neurological: negative for dizziness, headaches, vertigo and weakness Endocrine: negative for diabetic symptoms including polydipsia, polyuria and skin dryness Allergic/Immunologic: negative for hay fever and urticaria Psychological:  positive  for - anxiety. Negative for - depression or irritability    Objective:  Blood pressure 90/64, pulse 76, height 5\' 5"  (1.651 m), weight 165 lb 9.6 oz (75.1 kg), last menstrual period 04/25/2019. Body mass index is 27.56 kg/m.  General Appearance:    Alert, cooperative, no distress, appears stated age, overweight  Head:    Normocephalic, without obvious abnormality, atraumatic  Eyes:    PERRL, conjunctiva/corneas clear, EOM's intact, both eyes  Ears:    Normal external ear canals, both ears  Nose:   Nares normal, septum midline, mucosa normal, no drainage or sinus tenderness  Throat:   Lips, mucosa, and tongue normal;  teeth and gums normal  Neck:   Supple, symmetrical, trachea midline, no adenopathy; thyroid: no enlargement/tenderness/nodules; no carotid bruit or JVD  Back:     Symmetric, no curvature, ROM normal, no CVA tenderness  Lungs:     Clear to auscultation bilaterally, respirations unlabored  Chest Wall:    No tenderness or deformity   Heart:    Regular rate and rhythm, S1 and S2 normal, no murmur, rub or gallop  Breast Exam:    No tenderness, masses, or nipple abnormality  Abdomen:     Soft, non-tender, bowel sounds active all four quadrants, no masses, no organomegaly.    Genitalia:    Pelvic:external genitalia normal, vagina without lesions, discharge, or tenderness, rectovaginal septum  normal. Cervix normal in appearance, no cervical motion tenderness, no adnexal masses or tenderness.  Uterus normal size, shape, mobile, regular contours, nontender.  Rectal:    Normal external sphincter.  No hemorrhoids appreciated. Internal exam not done.   Extremities:   Extremities normal, atraumatic, no cyanosis or edema  Pulses:   2+ and symmetric all extremities  Skin:   Skin color, texture, turgor normal, no rashes or lesions  Lymph nodes:   Cervical, supraclavicular, and axillary nodes normal  Neurologic:   CNII-XII intact, normal strength, sensation and reflexes throughout   .  Labs:   Reviewed labs from [place] (08/2018)  Assessment:   1. Encounter for well woman exam with routine gynecological exam   2. Breast cancer screening by mammogram   3. Acquired hypothyroidism   4. Screening for diabetes mellitus   5. Anxiety   6. Perimenopausal menorrhagia     Plan:    Blood tests: patient notes she was supposed to have her thyroid levels rechecked by PCP, who is now no longer at the practice. Will order today. Will also rescreen for DM.  Breast self exam technique reviewed and patient encouraged to perform self-exam monthly. Contraception: tubal ligation. Discussed healthy lifestyle modifications. Mammogram ordered. Pap smear up to date. Continue q 3 year screens. Due in 2022.  Up to date with flu vaccine. Dyslipdemia and Hypothyroidism previously managed by PCP. Plans to find a new PCP as her prior one is no longer at the practice.  Discussed anxiety symptoms in relationship with her menses (as well as her migraines) and increase in flow. Discussed management options, including HRT, progesterone-only contraceptives (OCPs, IUD), or increasing Celexa dose or supplementing with an additional anxiolytic around the time of menses, surgical management (endometrial ablation or hysterectomy, but would not affect her anxiety). Given handout on options. Patient leaning towards IUD.   Follow up in 1 year. Follow up sooner if patient desires IUD placement.      Rubie Maid, MD [facility]

## 2019-05-18 LAB — GLUCOSE, RANDOM: Glucose: 88 mg/dL (ref 65–99)

## 2019-05-18 LAB — THYROID PANEL WITH TSH
Free Thyroxine Index: 1.9 (ref 1.2–4.9)
T3 Uptake Ratio: 28 % (ref 24–39)
T4, Total: 6.9 ug/dL (ref 4.5–12.0)
TSH: 3.25 u[IU]/mL (ref 0.450–4.500)

## 2019-05-29 MED ORDER — NORETHINDRONE 0.35 MG PO TABS: 1.0000 | ORAL_TABLET | Freq: Every day | ORAL | 3 refills | Status: DC

## 2019-11-10 ENCOUNTER — Other Ambulatory Visit: Payer: Self-pay

## 2019-11-10 ENCOUNTER — Ambulatory Visit
Admission: RE | Admit: 2019-11-10 | Discharge: 2019-11-10 | Disposition: A | Discharge status: Home / Self Care | Payer: BC Managed Care – PPO | Source: Ambulatory Visit | Attending: Obstetrics and Gynecology | Admitting: Obstetrics and Gynecology

## 2019-11-10 DIAGNOSIS — Z1231 Encounter for screening mammogram for malignant neoplasm of breast: Secondary | ICD-10-CM | POA: Diagnosis not present

## 2020-01-25 MED ORDER — BUSPIRONE HCL 5 MG PO TABS: 5.0000 mg | ORAL_TABLET | Freq: Three times a day (TID) | ORAL | 3 refills | Status: DC

## 2020-04-22 ENCOUNTER — Other Ambulatory Visit: Payer: Self-pay | Admitting: Obstetrics and Gynecology

## 2020-04-23 NOTE — Telephone Encounter (Signed)
Patient has appt on 05/18/20, will receive further refills then.

## 2020-05-18 ENCOUNTER — Encounter: Payer: Self-pay | Admitting: Obstetrics and Gynecology

## 2020-05-18 ENCOUNTER — Ambulatory Visit (INDEPENDENT_AMBULATORY_CARE_PROVIDER_SITE_OTHER): Payer: BC Managed Care – PPO | Admitting: Obstetrics and Gynecology

## 2020-05-18 ENCOUNTER — Other Ambulatory Visit (HOSPITAL_COMMUNITY)
Admission: RE | Admit: 2020-05-18 | Discharge: 2020-05-18 | Disposition: A | Discharge status: Home / Self Care | Payer: BC Managed Care – PPO | Source: Ambulatory Visit | Attending: Obstetrics and Gynecology | Admitting: Obstetrics and Gynecology

## 2020-05-18 ENCOUNTER — Other Ambulatory Visit: Payer: Self-pay

## 2020-05-18 VITALS — BP 101/67 | HR 73 | Ht 65.0 in | Wt 156.9 lb

## 2020-05-18 DIAGNOSIS — E785 Hyperlipidemia, unspecified: Secondary | ICD-10-CM

## 2020-05-18 DIAGNOSIS — Z124 Encounter for screening for malignant neoplasm of cervix: Secondary | ICD-10-CM | POA: Insufficient documentation

## 2020-05-18 DIAGNOSIS — Z131 Encounter for screening for diabetes mellitus: Secondary | ICD-10-CM | POA: Diagnosis not present

## 2020-05-18 DIAGNOSIS — N924 Excessive bleeding in the premenopausal period: Secondary | ICD-10-CM | POA: Diagnosis not present

## 2020-05-18 DIAGNOSIS — Z1231 Encounter for screening mammogram for malignant neoplasm of breast: Secondary | ICD-10-CM

## 2020-05-18 DIAGNOSIS — Z01419 Encounter for gynecological examination (general) (routine) without abnormal findings: Secondary | ICD-10-CM

## 2020-05-18 DIAGNOSIS — F419 Anxiety disorder, unspecified: Secondary | ICD-10-CM

## 2020-05-18 DIAGNOSIS — E559 Vitamin D deficiency, unspecified: Secondary | ICD-10-CM

## 2020-05-18 DIAGNOSIS — E039 Hypothyroidism, unspecified: Secondary | ICD-10-CM

## 2020-05-18 MED ORDER — BUSPIRONE HCL 10 MG PO TABS: 10.0000 mg | ORAL_TABLET | Freq: Three times a day (TID) | ORAL | 3 refills | Status: DC

## 2020-05-18 NOTE — Progress Notes (Signed)
Pt present for annual exam. Pt stated that she was doing well.  PHQ-9=14. GAD-7=10.

## 2020-05-18 NOTE — Progress Notes (Signed)
GYNECOLOGY ANNUAL PHYSICAL EXAM PROGRESS NOTE  Subjective:    Nancy Patterson is a 50 y.o. G59P2002 female who presents for an annual exam.  The patient is sexually active.  The patient wears seatbelts: yes. The patient participates in regular exercise: no. Has the patient ever been transfused or tattooed?: no. The patient reports that there is not domestic violence in her life.    The patient has the following complaints today.  1. Reports last cycle lasted 3 weeks. Was very light, sometimes only spotting.  Currently on Slynd for heavier menstrual cycles.Began March 17th. Has not had a cycle in April. Does report missing 2 pills out of current pill pack. Also notes that she thinks she missed a week of her pills last month due to misplacing them.  2. Notes 5 mg of Buspar is not helping with her anxiety, has had to increase to 10 mg. Notes overall her anxiety has increased this year.  Also on Celexa.    Gynecologic History Menarche age: 60 Patient's last menstrual period was 04/05/2020. Contraception: tubal ligation History of STI's: Denies Last Pap: 02/03/2017. Results were: normal.  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 Last mammogram: 11/10/2019. Results were: normal   OB History  Gravida Para Term Preterm AB Living  2 2 2  0 0 2  SAB IAB Ectopic Multiple Live Births  0 0 0 0 2    # Outcome Date GA Lbr Len/2nd Weight Sex Delivery Anes PTL Lv  2 Term 10/13/89    F Vag-Spont   LIV  1 Term 01/29/88    03/28/88   LIV    Past Medical History:  Diagnosis Date  . Dyslipidemia   . Headache    migraine  . Hypothyroidism   . Increased BMI   . Menstrual headache   . PMDD (premenstrual dysphoric disorder)   . Vaginal Pap smear, abnormal    cervical dysplasia  . Vaginitis   . Vitamin D deficiency     Past Surgical History:  Procedure Laterality Date  . REFRACTIVE SURGERY    . TUBAL LIGATION      Family History  Problem Relation Age of Onset  . Heart  disease Father   . Diabetes Father   . Colon cancer Paternal Uncle   . Cancer Paternal Uncle        blood cancer  . Hyperlipidemia Paternal Grandmother   . Diabetes Mother   . Multiple sclerosis Mother   . Breast cancer Neg Hx   . Ovarian cancer Neg Hx     Social History   Socioeconomic History  . Marital status: Married    Spouse name: Not on file  . Number of children: Not on file  . Years of education: Not on file  . Highest education level: Not on file  Occupational History  . Not on file  Tobacco Use  . Smoking status: Former Smoker    Quit date: 2000    Years since quitting: 22.3  . Smokeless tobacco: Never Used  Vaping Use  . Vaping Use: Never used  Substance and Sexual Activity  . Alcohol use: No  . Drug use: No  . Sexual activity: Yes    Birth control/protection: Surgical  Other Topics Concern  . Not on file  Social History Narrative  . Not on file   Social Determinants of Health   Financial Resource Strain: Not on file  Food Insecurity: Not on file  Transportation Needs: Not  on file  Physical Activity: Not on file  Stress: Not on file  Social Connections: Not on file  Intimate Partner Violence: Not on file    Current Outpatient Medications on File Prior to Visit  Medication Sig Dispense Refill  . busPIRone (BUSPAR) 5 MG tablet Take 1 tablet (5 mg total) by mouth 3 (three) times daily. 90 tablet 3  . citalopram (CELEXA) 40 MG tablet Take 40 mg by mouth daily.    Marland Kitchen co-enzyme Q-10 30 MG capsule Take 30 mg by mouth 3 (three) times daily.    . fexofenadine (ALLEGRA) 60 MG tablet Take 60 mg by mouth 2 (two) times daily.    Marland Kitchen levothyroxine (SYNTHROID, LEVOTHROID) 50 MCG tablet Take 50 mcg by mouth daily before breakfast.    . Multiple Vitamin (MULTIVITAMIN) capsule Take 1 capsule by mouth daily.    . norethindrone (MICRONOR) 0.35 MG tablet Take 1 tablet by mouth once daily 28 tablet 0  . RA KRILL OIL 500 MG CAPS Take by mouth.    . rosuvastatin (CRESTOR)  5 MG tablet Take 5 mg by mouth daily.    . SUMAtriptan (IMITREX) 100 MG tablet as needed.    Marland Kitchen VITAMIN D PO Take 2,000 mg by mouth.     No current facility-administered medications on file prior to visit.    Allergies  Allergen Reactions  . Azithromycin      Review of Systems Constitutional: negative for chills, fatigue, fevers and sweats Eyes: negative for irritation, redness and visual disturbance Ears, nose, mouth, throat, and face: negative for hearing loss, nasal congestion, snoring and tinnitus Respiratory: negative for asthma, cough, sputum Cardiovascular: negative for chest pain, dyspnea, exertional chest pressure/discomfort, irregular heart beat, palpitations and syncope Gastrointestinal: negative for abdominal pain, change in bowel habits, nausea and vomiting Genitourinary: positive for abnormal menstrual periods (see HPI).  Negative for genital lesions, sexual problems and vaginal discharge, dysuria and urinary incontinence Integument/breast: negative for breast lump, breast tenderness and nipple discharge Hematologic/lymphatic: negative for bleeding and easy bruising Musculoskeletal:negative for back pain and muscle weakness Neurological: negative for dizziness, headaches, vertigo and weakness Endocrine: negative for diabetic symptoms including polydipsia, polyuria and skin dryness Allergic/Immunologic: negative for hay fever and urticaria Psychological:  positive for - anxiety. Negative for - depression or irritability    Objective:  Blood pressure 101/67, pulse 73, height 5\' 5"  (1.651 m), weight 156 lb 14.4 oz (71.2 kg), last menstrual period 04/05/2020. Body mass index is 26.11 kg/m.  General Appearance:    Alert, cooperative, no distress, appears stated age, overweight  Head:    Normocephalic, without obvious abnormality, atraumatic  Eyes:    PERRL, conjunctiva/corneas clear, EOM's intact, both eyes  Ears:    Normal external ear canals, both ears  Nose:   Nares  normal, septum midline, mucosa normal, no drainage or sinus tenderness  Throat:   Lips, mucosa, and tongue normal; teeth and gums normal  Neck:   Supple, symmetrical, trachea midline, no adenopathy; thyroid: no enlargement/tenderness/nodules; no carotid bruit or JVD  Back:     Symmetric, no curvature, ROM normal, no CVA tenderness  Lungs:     Clear to auscultation bilaterally, respirations unlabored  Chest Wall:    No tenderness or deformity   Heart:    Regular rate and rhythm, S1 and S2 normal, no murmur, rub or gallop  Breast Exam:    No tenderness, masses, or nipple abnormality  Abdomen:     Soft, non-tender, bowel sounds active all four quadrants,  no masses, no organomegaly.    Genitalia:    Pelvic:external genitalia normal, vagina without lesions, discharge, or tenderness, rectovaginal septum  normal. Cervix normal in appearance, no cervical motion tenderness, no adnexal masses or tenderness.  Uterus normal size, shape, mobile, regular contours, nontender.  Rectal:    Normal external sphincter.  No hemorrhoids appreciated. Internal exam not done.   Extremities:   Extremities normal, atraumatic, no cyanosis or edema  Pulses:   2+ and symmetric all extremities  Skin:   Skin color, texture, turgor normal, no rashes or lesions  Lymph nodes:   Cervical, supraclavicular, and axillary nodes normal  Neurologic:   CNII-XII intact, normal strength, sensation and reflexes throughout     Depression screen PHQ 2/9 05/18/2020  Decreased Interest 2  Down, Depressed, Hopeless 1  PHQ - 2 Score 3  Altered sleeping 3  Tired, decreased energy 3  Change in appetite 3  Feeling bad or failure about yourself  0  Trouble concentrating 2  Moving slowly or fidgety/restless 0  Suicidal thoughts 0  PHQ-9 Score 14  Difficult doing work/chores Somewhat difficult    GAD 7 : Generalized Anxiety Score 05/18/2020 05/17/2019  Nervous, Anxious, on Edge 1 1  Control/stop worrying 1 2  Worry too much - different  things 2 1  Trouble relaxing 3 3  Restless 0 1  Easily annoyed or irritable 1 3  Afraid - awful might happen 2 2  Total GAD 7 Score 10 13  Anxiety Difficulty Somewhat difficult Somewhat difficult       Labs:  Reviewed labs from [facility]   Assessment:   1. Encounter for well woman exam with routine gynecological exam   2. Screening for diabetes mellitus   3. Dyslipidemia   4. Anxiety   5. Perimenopausal menorrhagia   6. Breast cancer screening by mammogram   7. Acquired hypothyroidism   8. Avitaminosis D     Plan:    Blood tests: patient desires to recheck her cholesterol and diabetes screen as she was on keto for 2 years and lost 30 lbs. Now doing Weight Watchers but is concerned that her levels may be going up. Will order.  Breast self exam technique reviewed and patient encouraged to perform self-exam monthly. Mammogram ordered. Contraception: tubal ligation. Discussed healthy lifestyle modifications. Pap smear performed today.  Declined COVID vaccine.  Dyslipdemia and Hypothyroidism previously managed by PCP however left the practice and has not yet been established with a new one. Discussed anxiety symptoms again (previously related to timing of menses but now is overall more general), as well as her migraines associated with her cycle (which have only slightly decreased with use of Micronor), and her issues with compliance with the pills lately. Revisited management options, including HRT (patch as patient is having compliance issues with a daily regimen), IUD.  Also will increase Buspar prescription to 10 mg as she has already self increased. Currently also sill taking her Celexa. Previously mentioned surgical management last visit(endometrial ablation or hysterectomy to help with her perimenopausal bleeding, but would not necessarily her anxiety). Patient ok to proceed and try IUD today after discussion of risks and benefits. See procedure note below. Follow up in 4  weeks for IUD check.   Follow up in 1 year for annual exam.      IUD Insertion Procedure Note Patient identified, informed consent performed, consent signed.   Discussed risks of irregular bleeding, cramping, infection, malpositioning or misplacement of the IUD outside the uterus which  may require further procedure such as laparoscopy. Also discussed >99% contraception efficacy, increased risk of ectopic pregnancy with failure of method.   Emphasized that this did not protect against STIs, condoms recommended during all sexual encounters. Time out was performed.  Urine pregnancy test negative.  Speculum placed in the vagina.  Cervix visualized.  Cleaned with Betadine x 2.  Grasped anteriorly with a single tooth tenaculum.  Uterus sounded to 9 cm.  Mirena IUD placed per manufacturer's recommendations.  Strings trimmed to 3 cm. Tenaculum was removed, good hemostasis noted.  Patient tolerated procedure well.   Patient was given post-procedure instructions.  She was advised to have backup contraception for one week.  Patient was also asked to check IUD strings periodically and follow up in 4 weeks for IUD check.   Lot: [id number] Exp: [id number]   Hildred Laser, MD [facility]

## 2020-05-18 NOTE — Addendum Note (Signed)
Addended by: Fabian November on: 05/18/2020 01:52 PM   Modules accepted: Orders

## 2020-05-18 NOTE — Patient Instructions (Addendum)
Preventive Care 84-50 Years Old, Female Preventive care refers to lifestyle choices and visits with your health care provider that can promote health and wellness. This includes:  A yearly physical exam. This is also called an annual wellness visit.  Regular dental and eye exams.  Immunizations.  Screening for certain conditions.  Healthy lifestyle choices, such as: ? Eating a healthy diet. ? Getting regular exercise. ? Not using drugs or products that contain nicotine and tobacco. ? Limiting alcohol use. What can I expect for my preventive care visit? Physical exam Your health care provider will check your:  Height and weight. These may be used to calculate your BMI (body mass index). BMI is a measurement that tells if you are at a healthy weight.  Heart rate and blood pressure.  Body temperature.  Skin for abnormal spots. Counseling Your health care provider may ask you questions about your:  Past medical problems.  Family's medical history.  Alcohol, tobacco, and drug use.  Emotional well-being.  Home life and relationship well-being.  Sexual activity.  Diet, exercise, and sleep habits.  Work and work Statistician.  Access to firearms.  Method of birth control.  Menstrual cycle.  Pregnancy history. What immunizations do I need? Vaccines are usually given at various ages, according to a schedule. Your health care provider will recommend vaccines for you based on your age, medical history, and lifestyle or other factors, such as travel or where you work.   What tests do I need? Blood tests  Lipid and cholesterol levels. These may be checked every 5 years, or more often if you are over 3 years old.  Hepatitis C test.  Hepatitis B test. Screening  Lung cancer screening. You may have this screening every year starting at age 73 if you have a 30-pack-year history of smoking and currently smoke or have quit within the past 15 years.  Colorectal cancer  screening. ? All adults should have this screening starting at age 52 and continuing until age 17. ? Your health care provider may recommend screening at age 49 if you are at increased risk. ? You will have tests every 1-10 years, depending on your results and the type of screening test.  Diabetes screening. ? This is done by checking your blood sugar (glucose) after you have not eaten for a while (fasting). ? You may have this done every 1-3 years.  Mammogram. ? This may be done every 1-2 years. ? Talk with your health care provider about when you should start having regular mammograms. This may depend on whether you have a family history of breast cancer.  BRCA-related cancer screening. This may be done if you have a family history of breast, ovarian, tubal, or peritoneal cancers.  Pelvic exam and Pap test. ? This may be done every 3 years starting at age 10. ? Starting at age 11, this may be done every 5 years if you have a Pap test in combination with an HPV test. Other tests  STD (sexually transmitted disease) testing, if you are at risk.  Bone density scan. This is done to screen for osteoporosis. You may have this scan if you are at high risk for osteoporosis. Talk with your health care provider about your test results, treatment options, and if necessary, the need for more tests. Follow these instructions at home: Eating and drinking  Eat a diet that includes fresh fruits and vegetables, whole grains, lean protein, and low-fat dairy products.  Take vitamin and mineral supplements  as recommended by your health care provider.  Do not drink alcohol if: ? Your health care provider tells you not to drink. ? You are pregnant, may be pregnant, or are planning to become pregnant.  If you drink alcohol: ? Limit how much you have to 0-1 drink a day. ? Be aware of how much alcohol is in your drink. In the [place] one drink equals one 12 oz bottle of beer (355 mL), one 5 oz glass of  wine (148 mL), or one 1 oz glass of hard liquor (44 mL).   Lifestyle  Take daily care of your teeth and gums. Brush your teeth every morning and night with fluoride toothpaste. Floss one time each day.  Stay active. Exercise for at least 30 minutes 5 or more days each week.  Do not use any products that contain nicotine or tobacco, such as cigarettes, e-cigarettes, and chewing tobacco. If you need help quitting, ask your health care provider.  Do not use drugs.  If you are sexually active, practice safe sex. Use a condom or other form of protection to prevent STIs (sexually transmitted infections).  If you do not wish to become pregnant, use a form of birth control. If you plan to become pregnant, see your health care provider for a prepregnancy visit.  If told by your health care provider, take low-dose aspirin daily starting at age 75.  Find healthy ways to cope with stress, such as: ? Meditation, yoga, or listening to music. ? Journaling. ? Talking to a trusted person. ? Spending time with friends and family. Safety  Always wear your seat belt while driving or riding in a vehicle.  Do not drive: ? If you have been drinking alcohol. Do not ride with someone who has been drinking. ? When you are tired or distracted. ? While texting.  Wear a helmet and other protective equipment during sports activities.  If you have firearms in your house, make sure you follow all gun safety procedures. What's next?  Visit your health care provider once a year for an annual wellness visit.  Ask your health care provider how often you should have your eyes and teeth checked.  Stay up to date on all vaccines. This information is not intended to replace advice given to you by your health care provider. Make sure you discuss any questions you have with your health care provider. Document Revised: 10/11/2019 Document Reviewed: 09/17/2017 Elsevier Patient Education  2021 [place] Breast self-awareness is knowing how your breasts look and feel. Doing breast self-awareness is important. It allows you to catch a breast problem early while it is still small and can be treated. All women should do breast self-awareness, including women who have had breast implants. Tell your doctor if you notice a change in your breasts. What you need:  A mirror.  A well-lit room. How to do a breast self-exam A breast self-exam is one way to learn what is normal for your breasts and to check for changes. To do a breast self-exam: Look for changes 1. Take off all the clothes above your waist. 2. Stand in front of a mirror in a room with good lighting. 3. Put your hands on your hips. 4. Push your hands down. 5. Look at your breasts and nipples in the mirror to see if one breast or nipple looks different from the other. Check to see if: ? The shape of one breast is different. ? The size of  one breast is different. ? There are wrinkles, dips, and bumps in one breast and not the other. 6. Look at each breast for changes in the skin, such as: ? Redness. ? Scaly areas. 7. Look for changes in your nipples, such as: ? Liquid around the nipples. ? Bleeding. ? Dimpling. ? Redness. ? A change in where the nipples are.   Feel for changes 1. Lie on your back on the floor. 2. Feel each breast. To do this, follow these steps: ? Pick a breast to feel. ? Put the arm closest to that breast above your head. ? Use your other arm to feel the nipple area of your breast. Feel the area with the pads of your three middle fingers by making small circles with your fingers. For the first circle, press lightly. For the second circle, press harder. For the third circle, press even harder. ? Keep making circles with your fingers at the different pressures as you move down your breast. Stop when you feel your ribs. ? Move your fingers a little toward the center of your body. ? Start making  circles with your fingers again, this time going up until you reach your collarbone. ? Keep making up-and-down circles until you reach your armpit. Remember to keep using the three pressures. ? Feel the other breast in the same way. 3. Sit or stand in the tub or shower. 4. With soapy water on your skin, feel each breast the same way you did in step 2 when you were lying on the floor.   Write down what you find Writing down what you find can help you remember what to tell your doctor. Write down:  What is normal for each breast.  Any changes you find in each breast, including: ? The kind of changes you find. ? Whether you have pain. ? Size and location of any lumps.  When you last had your menstrual period. General tips  Check your breasts every month.  If you are breastfeeding, the best time to check your breasts is after you feed your baby or after you use a breast pump.  If you get menstrual periods, the best time to check your breasts is 5-7 days after your menstrual period is over.  With time, you will become comfortable with the self-exam, and you will begin to know if there are changes in your breasts. Contact a doctor if you:  See a change in the shape or size of your breasts or nipples.  See a change in the skin of your breast or nipples, such as red or scaly skin.  Have fluid coming from your nipples that is not normal.  Find a lump or thick area that was not there before.  Have pain in your breasts.  Have any concerns about your breast health. Summary  Breast self-awareness includes looking for changes in your breasts, as well as feeling for changes within your breasts.  Breast self-awareness should be done in front of a mirror in a well-lit room.  You should check your breasts every month. If you get menstrual periods, the best time to check your breasts is 5-7 days after your menstrual period is over.  Let your doctor know of any changes you see in your  breasts, including changes in size, changes on the skin, pain or tenderness, or fluid from your nipples that is not normal. This information is not intended to replace advice given to you by your health care provider. Make sure  you discuss any questions you have with your health care provider. Document Revised: 08/25/2017 Document Reviewed: 08/25/2017 Elsevier Patient Education  2021 [place].    Intrauterine Device Insertion, Care After This sheet gives you information about how to care for yourself after your procedure. Your health care provider may also give you more specific instructions. If you have problems or questions, contact your health care provider. What can I expect after the procedure? After the procedure, it is common to have:  Cramps and pain in the abdomen.  Bleeding. It may be light or heavy. This may last for a few days.  Lower back pain.  Dizziness.  Headaches.  Nausea. Follow these instructions at home:  Before resuming sexual activity, check to make sure that you can feel the IUD string or strings. You should be able to feel the end of the string below the opening of your cervix. If your IUD string is in place, you may resume sexual activity. ? If you had a hormonal IUD inserted more than 7 days after your most recent period started, you will need to use a backup method of birth control for 7 days after IUD insertion. Ask your health care provider whether this applies to you.  Continue to check that the IUD is still in place by feeling for the strings after every menstrual period, or once a month.  An IUD will not protect you from sexually transmitted infections (STIs). Use methods to prevent the exchange of body fluids between partners (barrier protection) every time you have sex. Barrier protection can be used during oral, vaginal, or anal sex. Commonly used barrier methods include: ? Female condom. ? Female condom. ? Dental dam.  Take over-the-counter  and prescription medicines only as told by your health care provider.  Keep all follow-up visits as told by your health care provider. This is important.   Contact a health care provider if:  You feel light-headed or weak.  You have any of the following problems with your IUD string or strings: ? The string bothers or hurts you or your sexual partner. ? You cannot feel the string. ? The string has gotten longer.  You can feel the IUD in your vagina.  You think you may be pregnant, or you miss your menstrual period.  You think you may have a sexually transmitted infection (STI). Get help right away if:  You have flu-like symptoms, such as tiredness (fatigue) and muscle aches.  You have a fever and chills.  You have bleeding that is heavier or lasts longer than a normal menstrual cycle.  You have abnormal or bad-smelling discharge from your vagina.  You develop abdominal pain that is new, is getting worse, or is not in the same area of earlier cramping and pain.  You have pain during sexual activity. Summary  After the procedure, it is common to have cramps and pain in the abdomen. It is also common to have light bleeding or heavier bleeding that is like your menstrual period.  Continue to check that the IUD is still in place by feeling for the strings after every menstrual period, or once a month.  Keep all follow-up visits as told by your health care provider. This is important.  Contact your health care provider if you have problems with your IUD strings, such as the string getting longer or bothering you or your sexual partner. This information is not intended to replace advice given to you by your health care provider. Make  sure you discuss any questions you have with your health care provider. Document Revised: 12/28/2018 Document Reviewed: 12/28/2018 Elsevier Patient Education  [place].

## 2020-05-19 LAB — HEMOGLOBIN A1C
Est. average glucose Bld gHb Est-mCnc: 100 mg/dL
Hgb A1c MFr Bld: 5.1 % (ref 4.8–5.6)

## 2020-05-19 LAB — TSH: TSH: 3.7 u[IU]/mL (ref 0.450–4.500)

## 2020-05-19 LAB — CBC
Hematocrit: 39.7 % (ref 34.0–46.6)
Hemoglobin: 13.4 g/dL (ref 11.1–15.9)
MCH: 32.2 pg (ref 26.6–33.0)
MCHC: 33.8 g/dL (ref 31.5–35.7)
MCV: 95 fL (ref 79–97)
Platelets: 207 10*3/uL (ref 150–450)
RBC: 4.16 x10E6/uL (ref 3.77–5.28)
RDW: 12 % (ref 11.7–15.4)
WBC: 7.7 10*3/uL (ref 3.4–10.8)

## 2020-05-19 LAB — LIPID PANEL
Chol/HDL Ratio: 2.3 ratio (ref 0.0–4.4)
Cholesterol, Total: 114 mg/dL (ref 100–199)
HDL: 49 mg/dL (ref >39)
LDL Chol Calc (NIH): 41 mg/dL (ref 0–99)
Triglycerides: 143 mg/dL (ref 0–149)
VLDL Cholesterol Cal: 24 mg/dL (ref 5–40)

## 2020-05-19 LAB — COMPREHENSIVE METABOLIC PANEL
ALT: 11 IU/L (ref 0–32)
AST: 15 IU/L (ref 0–40)
Albumin/Globulin Ratio: 1.8 (ref 1.2–2.2)
Albumin: 4.3 g/dL (ref 3.8–4.8)
Alkaline Phosphatase: 53 IU/L (ref 44–121)
BUN/Creatinine Ratio: 23 (ref 9–23)
BUN: 12 mg/dL (ref 6–24)
Bilirubin Total: 0.3 mg/dL (ref 0.0–1.2)
CO2: 24 mmol/L (ref 20–29)
Calcium: 9 mg/dL (ref 8.7–10.2)
Chloride: 102 mmol/L (ref 96–106)
Creatinine, Ser: 0.53 mg/dL — ABNORMAL LOW (ref 0.57–1.00)
Globulin, Total: 2.4 g/dL (ref 1.5–4.5)
Glucose: 75 mg/dL (ref 65–99)
Potassium: 4.9 mmol/L (ref 3.5–5.2)
Sodium: 139 mmol/L (ref 134–144)
Total Protein: 6.7 g/dL (ref 6.0–8.5)
eGFR: 113 mL/min/{1.73_m2} (ref >59)

## 2020-05-19 LAB — VITAMIN D 25 HYDROXY (VIT D DEFICIENCY, FRACTURES): Vit D, 25-Hydroxy: 49.4 ng/mL (ref 30.0–100.0)

## 2020-05-22 LAB — CYTOLOGY - PAP
Comment: NEGATIVE
Diagnosis: NEGATIVE
High risk HPV: NEGATIVE

## 2020-06-15 ENCOUNTER — Ambulatory Visit (INDEPENDENT_AMBULATORY_CARE_PROVIDER_SITE_OTHER): Payer: BC Managed Care – PPO | Admitting: Obstetrics and Gynecology

## 2020-06-15 ENCOUNTER — Encounter: Payer: Self-pay | Admitting: Obstetrics and Gynecology

## 2020-06-15 ENCOUNTER — Other Ambulatory Visit: Payer: Self-pay

## 2020-06-15 VITALS — BP 108/71 | HR 76 | Ht 65.0 in | Wt 153.7 lb

## 2020-06-15 DIAGNOSIS — Z30431 Encounter for routine checking of intrauterine contraceptive device: Secondary | ICD-10-CM | POA: Diagnosis not present

## 2020-06-15 DIAGNOSIS — Z975 Presence of (intrauterine) contraceptive device: Secondary | ICD-10-CM | POA: Insufficient documentation

## 2020-06-15 NOTE — Progress Notes (Signed)
Pt present for IUD check up. Pt stated that she was having irregular cycles and light bleeding since getting the IUD.

## 2020-06-15 NOTE — Progress Notes (Signed)
    GYNECOLOGY OFFICE ENCOUNTER NOTE History:  50 y.o. [id number] here today for today for IUD string check; Mirena  IUD was placed  1 month ago. Reports that she is still noting light bleeding daily since IUD placement.  No other concerning side effects.  The following portions of the patient's history were reviewed and updated as appropriate: allergies, current medications, past family history, past medical history, past social history, past surgical history and problem list. Last pap smear on 05/18/2020 was normal, negative HRHPV.  Review of Systems:  Pertinent items are noted in HPI.   Objective:  Physical Exam Blood pressure 108/71, pulse 76, height 5\' 5"  (1.651 m), weight 153 lb 11.2 oz (69.7 kg). CONSTITUTIONAL: Well-developed, well-nourished female in no acute distress.  ABDOMEN: Soft, no distention noted.   PELVIC: Normal appearing external genitalia; normal appearing vaginal mucosa and cervix.  IUD strings visualized, about 3 cm in length outside cervix.  EXTREMITIES: extremities normal, atraumatic, no cyanosis or edema NEUROLOGIC: Grossly normal   Assessment & Plan:  Patient to keep IUD in place for up to seven years; can come in for removal if she desires for any concerning side effects.  Discussed use of scheduled NSAIDs to help with persistent bleeding.     , MD [facility]

## 2020-09-12 ENCOUNTER — Other Ambulatory Visit: Payer: Self-pay | Admitting: Obstetrics and Gynecology

## 2020-09-12 DIAGNOSIS — Z1231 Encounter for screening mammogram for malignant neoplasm of breast: Secondary | ICD-10-CM

## 2020-11-12 ENCOUNTER — Ambulatory Visit
Admission: RE | Admit: 2020-11-12 | Discharge: 2020-11-12 | Disposition: A | Discharge status: Home / Self Care | Payer: BC Managed Care – PPO | Source: Ambulatory Visit | Attending: Obstetrics and Gynecology | Admitting: Obstetrics and Gynecology

## 2020-11-12 ENCOUNTER — Other Ambulatory Visit: Payer: Self-pay

## 2020-11-12 DIAGNOSIS — Z1231 Encounter for screening mammogram for malignant neoplasm of breast: Secondary | ICD-10-CM | POA: Diagnosis present

## 2020-12-07 LAB — EXTERNAL GENERIC LAB PROCEDURE: COLOGUARD: NEGATIVE

## 2020-12-07 LAB — COLOGUARD: COLOGUARD: NEGATIVE

## 2021-05-21 ENCOUNTER — Ambulatory Visit (INDEPENDENT_AMBULATORY_CARE_PROVIDER_SITE_OTHER): Payer: BC Managed Care – PPO | Admitting: Obstetrics and Gynecology

## 2021-05-21 ENCOUNTER — Encounter: Payer: Self-pay | Admitting: Obstetrics and Gynecology

## 2021-05-21 VITALS — BP 111/70 | HR 64 | Resp 16 | Ht 65.0 in | Wt 169.9 lb

## 2021-05-21 DIAGNOSIS — Z1231 Encounter for screening mammogram for malignant neoplasm of breast: Secondary | ICD-10-CM

## 2021-05-21 DIAGNOSIS — N924 Excessive bleeding in the premenopausal period: Secondary | ICD-10-CM | POA: Diagnosis not present

## 2021-05-21 DIAGNOSIS — Z131 Encounter for screening for diabetes mellitus: Secondary | ICD-10-CM

## 2021-05-21 DIAGNOSIS — Z975 Presence of (intrauterine) contraceptive device: Secondary | ICD-10-CM

## 2021-05-21 DIAGNOSIS — Z01419 Encounter for gynecological examination (general) (routine) without abnormal findings: Secondary | ICD-10-CM | POA: Diagnosis not present

## 2021-05-21 DIAGNOSIS — E559 Vitamin D deficiency, unspecified: Secondary | ICD-10-CM

## 2021-05-21 DIAGNOSIS — E785 Hyperlipidemia, unspecified: Secondary | ICD-10-CM | POA: Diagnosis not present

## 2021-05-21 DIAGNOSIS — E039 Hypothyroidism, unspecified: Secondary | ICD-10-CM

## 2021-05-21 DIAGNOSIS — R635 Abnormal weight gain: Secondary | ICD-10-CM

## 2021-05-21 NOTE — Patient Instructions (Signed)

## 2021-05-21 NOTE — Progress Notes (Signed)
? ? ?GYNECOLOGY ANNUAL PHYSICAL EXAM PROGRESS NOTE ? ?Subjective:  ? ? Nancy Patterson is a 51 y.o. [id number] female who presents for an annual exam. The patient has no complaints today. The patient is sexually active. The patient participates in regular exercise: no. Has the patient ever been transfused or tattooed?:  no . The patient reports that there is not domestic violence in her life.  ? ?[name] notes some weight gain over the past year. Wonders if it may be due to her IUD.  ? ?Menstrual History: ?Menarche age: 77 ?No LMP recorded. (Menstrual status: IUD). ?  ? ?Gynecologic History:  ?Contraception: tubal ligation ?History of STI's: Denies ?Last Pap: 05/18/2020. Results were: normal. Notes h/o abnormal pap smears in the past (HPV+). ?Last mammogram: 11/12/2020. Results were: normal ?Colonoscopy: 12/03/2020 performed Cologuard, results were normal.  ? ? ? ?OB History  ?Gravida Para Term Preterm AB Living  ?2 2 2  0 0 2  ?SAB IAB Ectopic Multiple Live Births  ?0 0 0 0 2  ?  ?# Outcome Date GA Lbr Len/2nd Weight Sex Delivery Anes PTL Lv  ?2 Term 10/13/89    F Vag-Spont   LIV  ?1 Term 01/29/88    M Vag-Spont   LIV  ? ? ?Past Medical History:  ?Diagnosis Date  ? Dyslipidemia   ? Headache   ? migraine  ? Hypothyroidism   ? Increased BMI   ? Menstrual headache   ? PMDD (premenstrual dysphoric disorder)   ? Vaginal Pap smear, abnormal   ? cervical dysplasia  ? Vaginitis   ? Vitamin D deficiency   ? ? ?Past Surgical History:  ?Procedure Laterality Date  ? REFRACTIVE SURGERY    ? TUBAL LIGATION    ? ? ?Family History  ?Problem Relation Age of Onset  ? Heart disease Father   ? Diabetes Father   ? Colon cancer Paternal Uncle   ? Cancer Paternal Uncle   ?     blood cancer  ? Hyperlipidemia Paternal Grandmother   ? Diabetes Mother   ? Multiple sclerosis Mother   ? Breast cancer Neg Hx   ? Ovarian cancer Neg Hx   ? ? ?Social History  ? ?Socioeconomic History  ? Marital status: Married  ?  Spouse name: Not on file  ? Number of  children: Not on file  ? Years of education: Not on file  ? Highest education level: Not on file  ?Occupational History  ? Not on file  ?Tobacco Use  ? Smoking status: Former  ?  Types: Cigarettes  ?  Quit date: 2000  ?  Years since quitting: 23.3  ? Smokeless tobacco: Never  ?Vaping Use  ? Vaping Use: Never used  ?Substance and Sexual Activity  ? Alcohol use: No  ? Drug use: No  ? Sexual activity: Yes  ?  Birth control/protection: I.U.D.  ?Other Topics Concern  ? Not on file  ?Social History Narrative  ? Not on file  ? ?Social Determinants of Health  ? ?Financial Resource Strain: Not on file  ?Food Insecurity: Not on file  ?Transportation Needs: Not on file  ?Physical Activity: Not on file  ?Stress: Not on file  ?Social Connections: Not on file  ?Intimate Partner Violence: Not on file  ? ? ?Current Outpatient Medications on File Prior to Visit  ?Medication Sig Dispense Refill  ? busPIRone (BUSPAR) 10 MG tablet Take 1 tablet (10 mg total) by mouth 3 (three) times daily. 180 tablet 3  ?  citalopram (CELEXA) 40 MG tablet Take 40 mg by mouth daily.    ? co-enzyme Q-10 30 MG capsule Take 30 mg by mouth 3 (three) times daily.    ? fexofenadine (ALLEGRA) 60 MG tablet Take 60 mg by mouth 2 (two) times daily.    ? levonorgestrel (MIRENA) 20 MCG/DAY IUD 1 each by Intrauterine route once. Inserted 05/18/2020 by Rubie Maid, MD at [facility], [place]    ? levothyroxine (SYNTHROID, LEVOTHROID) 50 MCG tablet Take 50 mcg by mouth daily before breakfast.    ? Multiple Vitamin (MULTIVITAMIN) capsule Take 1 capsule by mouth daily.    ? RA KRILL OIL 500 MG CAPS Take by mouth.    ? rosuvastatin (CRESTOR) 5 MG tablet Take 5 mg by mouth daily.    ? SUMAtriptan (IMITREX) 100 MG tablet as needed.    ? VITAMIN D PO Take 2,000 mg by mouth.    ? ?No current facility-administered medications on file prior to visit.  ? ? ?Allergies  ?Allergen Reactions  ? Azithromycin   ? ? ? ?Review of Systems ?Constitutional: negative for  chills, fatigue, fevers and sweats. + weight gain ?Eyes: negative for irritation, redness and visual disturbance ?Ears, nose, mouth, throat, and face: negative for hearing loss, nasal congestion, snoring and tinnitus ?Respiratory: negative for asthma, cough, sputum ?Cardiovascular: negative for chest pain, dyspnea, exertional chest pressure/discomfort, irregular heart beat, palpitations and syncope ?Gastrointestinal: negative for abdominal pain, change in bowel habits, nausea and vomiting ?Genitourinary: negative for abnormal menstrual periods, genital lesions, sexual problems and vaginal discharge, dysuria and urinary incontinence ?Integument/breast: negative for breast lump, breast tenderness and nipple discharge ?Hematologic/lymphatic: negative for bleeding and easy bruising ?Musculoskeletal:negative for back pain and muscle weakness ?Neurological: negative for dizziness, headaches, vertigo and weakness ?Endocrine: negative for diabetic symptoms including polydipsia, polyuria and skin dryness ?Allergic/Immunologic: negative for hay fever and urticaria    ? ? ?Objective:  ?Blood pressure 111/70, pulse 64, resp. rate 16, height 5\' 5"  (1.651 m), weight 169 lb 14.4 oz (77.1 kg).  Body mass index is 28.27 kg/m?. ? ?General Appearance:    Alert, cooperative, no distress, appears stated age, overweight  ?Head:    Normocephalic, without obvious abnormality, atraumatic  ?Eyes:    PERRL, conjunctiva/corneas clear, EOM's intact, both eyes  ?Ears:    Normal external ear canals, both ears  ?Nose:   Nares normal, septum midline, mucosa normal, no drainage or sinus tenderness  ?Throat:   Lips, mucosa, and tongue normal; teeth and gums normal  ?Neck:   Supple, symmetrical, trachea midline, no adenopathy; thyroid: no enlargement/tenderness/nodules; no carotid bruit or JVD  ?Back:     Symmetric, no curvature, ROM normal, no CVA tenderness  ?Lungs:     Clear to auscultation bilaterally, respirations unlabored  ?Chest Wall:    No  tenderness or deformity  ? Heart:    Regular rate and rhythm, S1 and S2 normal, no murmur, rub or gallop  ?Breast Exam:    No tenderness, masses, or nipple abnormality  ?Abdomen:     Soft, non-tender, bowel sounds active all four quadrants, no masses, no organomegaly.    ?Genitalia:    Pelvic:external genitalia normal, vagina without lesions, discharge, or tenderness, rectovaginal septum  normal. Cervix normal in appearance, no cervical motion tenderness, IUD threads visible, 3 cm in length.  No adnexal masses or tenderness.  Uterus normal size, shape, mobile, regular contours, nontender.  ?Rectal:    Normal external sphincter.  No hemorrhoids appreciated. Internal exam  not done.   ?Extremities:   Extremities normal, atraumatic, no cyanosis or edema  ?Pulses:   2+ and symmetric all extremities  ?Skin:   Skin color, texture, turgor normal, no rashes or lesions  ?Lymph nodes:   Cervical, supraclavicular, and axillary nodes normal  ?Neurologic:   CNII-XII intact, normal strength, sensation and reflexes throughout  ? ? ? ?Labs:  ?Lab Results  ?Component Value Date  ? WBC 7.7 05/18/2020  ? HGB 13.4 05/18/2020  ? HCT 39.7 05/18/2020  ? MCV 95 05/18/2020  ? PLT 207 05/18/2020  ? ? ?Lab Results  ?Component Value Date  ? CREATININE 0.53 (L) 05/18/2020  ? BUN 12 05/18/2020  ? NA 139 05/18/2020  ? K 4.9 05/18/2020  ? CL 102 05/18/2020  ? CO2 24 05/18/2020  ? ? ?Lab Results  ?Component Value Date  ? ALT 11 05/18/2020  ? AST 15 05/18/2020  ? ALKPHOS 53 05/18/2020  ? BILITOT 0.3 05/18/2020  ? ? ?Lab Results  ?Component Value Date  ? TSH 3.700 05/18/2020  ? ? ? ?Assessment:  ? ?1. Well woman exam with routine gynecological exam   ?2. Screening for diabetes mellitus   ?3. Dyslipidemia   ?4. Perimenopausal menorrhagia   ?5. Avitaminosis D   ?6. Acquired hypothyroidism   ?7. Breast cancer screening by mammogram   ?8. IUD (intrauterine device) in place   ? ?  ?Plan:  ?- Blood tests: See orders.  Patient had labs drawn with PCP  approximately 6 months ago, however likes to recheck her labs every 6 months just to monitor.. ?- Breast self exam technique reviewed and patient encouraged to perform self-exam monthly. ?- Contraception: tubal ligatio

## 2021-05-22 ENCOUNTER — Other Ambulatory Visit: Payer: BC Managed Care – PPO

## 2021-05-22 DIAGNOSIS — E039 Hypothyroidism, unspecified: Secondary | ICD-10-CM

## 2021-05-22 DIAGNOSIS — E785 Hyperlipidemia, unspecified: Secondary | ICD-10-CM

## 2021-05-22 DIAGNOSIS — Z01419 Encounter for gynecological examination (general) (routine) without abnormal findings: Secondary | ICD-10-CM

## 2021-05-22 DIAGNOSIS — Z131 Encounter for screening for diabetes mellitus: Secondary | ICD-10-CM

## 2021-05-22 DIAGNOSIS — E559 Vitamin D deficiency, unspecified: Secondary | ICD-10-CM

## 2021-05-23 LAB — CBC
Hematocrit: 42.7 % (ref 34.0–46.6)
Hemoglobin: 14.6 g/dL (ref 11.1–15.9)
MCH: 31.7 pg (ref 26.6–33.0)
MCHC: 34.2 g/dL (ref 31.5–35.7)
MCV: 93 fL (ref 79–97)
Platelets: 243 10*3/uL (ref 150–450)
RBC: 4.61 x10E6/uL (ref 3.77–5.28)
RDW: 12.3 % (ref 11.7–15.4)
WBC: 8.1 10*3/uL (ref 3.4–10.8)

## 2021-05-23 LAB — LIPID PANEL
Chol/HDL Ratio: 2 ratio (ref 0.0–4.4)
Cholesterol, Total: 103 mg/dL (ref 100–199)
HDL: 52 mg/dL (ref >39)
LDL Chol Calc (NIH): 30 mg/dL (ref 0–99)
Triglycerides: 117 mg/dL (ref 0–149)
VLDL Cholesterol Cal: 21 mg/dL (ref 5–40)

## 2021-05-23 LAB — HEMOGLOBIN A1C
Est. average glucose Bld gHb Est-mCnc: 103 mg/dL
Hgb A1c MFr Bld: 5.2 % (ref 4.8–5.6)

## 2021-05-23 LAB — VITAMIN D 25 HYDROXY (VIT D DEFICIENCY, FRACTURES): Vit D, 25-Hydroxy: 38.9 ng/mL (ref 30.0–100.0)

## 2021-05-23 LAB — COMPREHENSIVE METABOLIC PANEL
ALT: 14 IU/L (ref 0–32)
AST: 20 IU/L (ref 0–40)
Albumin/Globulin Ratio: 1.6 (ref 1.2–2.2)
Albumin: 4.5 g/dL (ref 3.8–4.8)
Alkaline Phosphatase: 62 IU/L (ref 44–121)
BUN/Creatinine Ratio: 17 (ref 9–23)
BUN: 11 mg/dL (ref 6–24)
Bilirubin Total: 0.5 mg/dL (ref 0.0–1.2)
CO2: 24 mmol/L (ref 20–29)
Calcium: 9.2 mg/dL (ref 8.7–10.2)
Chloride: 102 mmol/L (ref 96–106)
Creatinine, Ser: 0.65 mg/dL (ref 0.57–1.00)
Globulin, Total: 2.9 g/dL (ref 1.5–4.5)
Glucose: 79 mg/dL (ref 70–99)
Potassium: 4.9 mmol/L (ref 3.5–5.2)
Sodium: 141 mmol/L (ref 134–144)
Total Protein: 7.4 g/dL (ref 6.0–8.5)
eGFR: 107 mL/min/{1.73_m2} (ref >59)

## 2021-05-23 LAB — TSH: TSH: 4.97 u[IU]/mL — ABNORMAL HIGH (ref 0.450–4.500)

## 2021-06-03 ENCOUNTER — Encounter: Payer: Self-pay | Admitting: Obstetrics and Gynecology

## 2021-06-03 DIAGNOSIS — E039 Hypothyroidism, unspecified: Secondary | ICD-10-CM

## 2021-06-03 DIAGNOSIS — R7989 Other specified abnormal findings of blood chemistry: Secondary | ICD-10-CM

## 2021-06-03 NOTE — Telephone Encounter (Signed)
I sent patient a mychart message to remind her that she needs to have T3 and T4 lab draw before any dose change in her medications.  ?

## 2021-06-06 ENCOUNTER — Other Ambulatory Visit: Payer: BC Managed Care – PPO

## 2021-06-06 DIAGNOSIS — E039 Hypothyroidism, unspecified: Secondary | ICD-10-CM

## 2021-06-06 DIAGNOSIS — R7989 Other specified abnormal findings of blood chemistry: Secondary | ICD-10-CM

## 2021-06-07 LAB — T3 UPTAKE: T3 Uptake Ratio: 24 % (ref 24–39)

## 2021-06-07 LAB — T4, FREE: Free T4: 0.76 ng/dL — ABNORMAL LOW (ref 0.82–1.77)

## 2021-11-13 ENCOUNTER — Ambulatory Visit
Admission: RE | Admit: 2021-11-13 | Discharge: 2021-11-13 | Disposition: A | Discharge status: Home / Self Care | Payer: BC Managed Care – PPO | Source: Ambulatory Visit | Attending: Obstetrics and Gynecology | Admitting: Obstetrics and Gynecology

## 2021-11-13 DIAGNOSIS — Z1231 Encounter for screening mammogram for malignant neoplasm of breast: Secondary | ICD-10-CM | POA: Diagnosis present

## 2022-05-03 ENCOUNTER — Other Ambulatory Visit: Payer: Self-pay | Admitting: Obstetrics and Gynecology

## 2022-05-26 NOTE — Progress Notes (Unsigned)
GYNECOLOGY ANNUAL PHYSICAL EXAM PROGRESS NOTE  Subjective:    Nancy Patterson is a 52 y.o. [id number] female who presents for an annual exam. The patient is sexually active. The patient participates in regular exercise: yes. Has the patient ever been transfused or tattooed?: no. The patient reports that there is not domestic violence in her life.   The patient has the following complaints today: Feels like she has been gaining more weight in the past few months. Has not changed anything in her dietary or normal physical routine.   Menstrual History: Menarche age: 59 No LMP recorded. (Menstrual status: IUD).    Gynecologic History:  Contraception: tubal ligation History of STI's: Denies Last Pap: 05/18/2020. Results were: normal. Notes h/o abnormal pap smears in the past (HPV+).  Last mammogram: 11/13/2021. Results were: normal   OB History  Gravida Para Term Preterm AB Living  2 2 2  0 0 2  SAB IAB Ectopic Multiple Live Births  0 0 0 0 2    # Outcome Date GA Lbr Len/2nd Weight Sex Delivery Anes PTL Lv  2 Term 10/13/89    F Vag-Spont   LIV  1 Term 01/29/88    M Vag-Spont   LIV    Past Medical History:  Diagnosis Date   Dyslipidemia    Headache    migraine   Hypothyroidism    Increased BMI    Menstrual headache    PMDD (premenstrual dysphoric disorder)    Vaginal Pap smear, abnormal    cervical dysplasia   Vaginitis    Vitamin D deficiency     Past Surgical History:  Procedure Laterality Date   REFRACTIVE SURGERY     TUBAL LIGATION      Family History  Problem Relation Age of Onset   Heart disease Father    Diabetes Father    Colon cancer Paternal Uncle    Cancer Paternal Uncle        blood cancer   Hyperlipidemia Paternal Grandmother    Diabetes Mother    Multiple sclerosis Mother    Breast cancer Neg Hx    Ovarian cancer Neg Hx     Social History   Socioeconomic History   Marital status: Married    Spouse name: Not on file   Number of children: Not  on file   Years of education: Not on file   Highest education level: Not on file  Occupational History   Not on file  Tobacco Use   Smoking status: Former    Types: Cigarettes    Quit date: 2000    Years since quitting: 24.3   Smokeless tobacco: Never  Vaping Use   Vaping Use: Never used  Substance and Sexual Activity   Alcohol use: No   Drug use: No   Sexual activity: Yes    Birth control/protection: I.U.D.  Other Topics Concern   Not on file  Social History Narrative   Not on file   Social Determinants of Health   Financial Resource Strain: Not on file  Food Insecurity: Not on file  Transportation Needs: Not on file  Physical Activity: Inactive (02/03/2017)   Exercise Vital Sign    Days of Exercise per Week: 0 days    Minutes of Exercise per Session: 0 min  Stress: Not on file  Social Connections: Not on file  Intimate Partner Violence: Not on file    Current Outpatient Medications on File Prior to Visit  Medication Sig Dispense Refill  busPIRone (BUSPAR) 10 MG tablet TAKE 1 TABLET BY MOUTH THREE TIMES DAILY 90 tablet 0   citalopram (CELEXA) 40 MG tablet Take 40 mg by mouth daily.     co-enzyme Q-10 30 MG capsule Take 30 mg by mouth 3 (three) times daily.     fexofenadine (ALLEGRA) 60 MG tablet Take 60 mg by mouth 2 (two) times daily.     levonorgestrel (MIRENA) 20 MCG/DAY IUD 1 each by Intrauterine route once. Inserted 05/18/2020 by Hildred Laser, MD at [facility] [place]     levothyroxine (SYNTHROID, LEVOTHROID) 50 MCG tablet Take 50 mcg by mouth daily before breakfast.     Multiple Vitamin (MULTIVITAMIN) capsule Take 1 capsule by mouth daily.     RA KRILL OIL 500 MG CAPS Take by mouth.     rosuvastatin (CRESTOR) 5 MG tablet Take 5 mg by mouth daily.     SUMAtriptan (IMITREX) 100 MG tablet as needed.     VITAMIN D PO Take 2,000 mg by mouth.     No current facility-administered medications on file prior to visit.    Allergies  Allergen  Reactions   Azithromycin    Erythromycin Diarrhea    Tolerate Zpak     Review of Systems Constitutional: negative for chills, fatigue, fevers and sweats. Positive for unintentional weight gain Eyes: negative for irritation, redness and visual disturbance Ears, nose, mouth, throat, and face: negative for hearing loss, nasal congestion, snoring and tinnitus Respiratory: negative for asthma, cough, sputum Cardiovascular: negative for chest pain, dyspnea, exertional chest pressure/discomfort, irregular heart beat, palpitations and syncope Gastrointestinal: negative for abdominal pain, change in bowel habits, nausea and vomiting Genitourinary: negative for abnormal menstrual periods, genital lesions, sexual problems and vaginal discharge, dysuria and urinary incontinence Integument/breast: negative for breast lump, breast tenderness and nipple discharge Hematologic/lymphatic: negative for bleeding and easy bruising Musculoskeletal:negative for back pain and muscle weakness Neurological: negative for dizziness, headaches, vertigo and weakness Endocrine: negative for diabetic symptoms including polydipsia, polyuria and skin dryness Allergic/Immunologic: negative for hay fever and urticaria      Objective:  Blood pressure 92/67, pulse 73, resp. rate 16, height 5\' 5"  (1.651 m), weight 185 lb 1.6 oz (84 kg). Body mass index is 30.8 kg/m.    General Appearance:    Alert, cooperative, no distress, appears stated age, mild obesity  Head:    Normocephalic, without obvious abnormality, atraumatic  Eyes:    PERRL, conjunctiva/corneas clear, EOM's intact, both eyes  Ears:    Normal external ear canals, both ears  Nose:   Nares normal, septum midline, mucosa normal, no drainage or sinus tenderness  Throat:   Lips, mucosa, and tongue normal; teeth and gums normal  Neck:   Supple, symmetrical, trachea midline, no adenopathy; thyroid: no enlargement/tenderness/nodules; no carotid bruit or JVD  Back:      Symmetric, no curvature, ROM normal, no CVA tenderness  Lungs:     Clear to auscultation bilaterally, respirations unlabored  Chest Wall:    No tenderness or deformity   Heart:    Regular rate and rhythm, S1 and S2 normal, no murmur, rub or gallop  Breast Exam:    No tenderness, masses, or nipple abnormality  Abdomen:     Soft, non-tender, bowel sounds active all four quadrants, no masses, no organomegaly.    Genitalia:    Pelvic:external genitalia normal, vagina without lesions, discharge, or tenderness, rectovaginal septum  normal. Cervix normal in appearance, no cervical motion tenderness, IUD threads visible, 3 cm  in length from cervical os. No adnexal masses or tenderness.  Uterus normal size, shape, mobile, regular contours, nontender.  Rectal:    Normal external sphincter.  No hemorrhoids appreciated. Internal exam not done.   Extremities:   Extremities normal, atraumatic, no cyanosis or edema  Pulses:   2+ and symmetric all extremities  Skin:   Skin color, texture, turgor normal, no rashes or lesions  Lymph nodes:   Cervical, supraclavicular, and axillary nodes normal  Neurologic:   CNII-XII intact, normal strength, sensation and reflexes throughout   .  Labs:  Lab Results  Component Value Date   WBC 8.1 05/22/2021   HGB 14.6 05/22/2021   HCT 42.7 05/22/2021   MCV 93 05/22/2021   PLT 243 05/22/2021    Lab Results  Component Value Date   CREATININE 0.65 05/22/2021   BUN 11 05/22/2021   NA 141 05/22/2021   K 4.9 05/22/2021   CL 102 05/22/2021   CO2 24 05/22/2021    Lab Results  Component Value Date   ALT 14 05/22/2021   AST 20 05/22/2021   ALKPHOS 62 05/22/2021   BILITOT 0.5 05/22/2021    Lab Results  Component Value Date   TSH 4.970 (H) 05/22/2021     Assessment:   1. Well woman exam with routine gynecological exam   2. Acquired hypothyroidism   3. Screening for diabetes mellitus   4. Dyslipidemia   5. Breast cancer screening by mammogram   6. Weight  gain   7. IUD (intrauterine device) in place      Plan:  Blood tests: ordered today (see orders). Breast self exam technique reviewed and patient encouraged to perform self-exam monthly. Contraception: tubal ligation. Discussed healthy lifestyle modifications. Mammogram  UTD .  Order placed as next mammogram due in October. Pap smear  UTD . IUD in place for management of perimenopausal bleeding (inserted 04/2020), doing well.  Weight gain, review of chart notes 16 lb weight gain since last year. Unclear cause. TSH ordered due to h/o hypothyroidism.  Can consider further evaluation if this returns normal. Dyslipidemia to be managed by PCP. Follow up in 1 year for annual exam   Hildred Laser, MD [facility]

## 2022-05-26 NOTE — Patient Instructions (Signed)
Preventive Care 40-52 Years Old, Female Preventive care refers to lifestyle choices and visits with your health care provider that can promote health and wellness. Preventive care visits are also called wellness exams. What can I expect for my preventive care visit? Counseling Your health care provider may ask you questions about your: Medical history, including: Past medical problems. Family medical history. Pregnancy history. Current health, including: Menstrual cycle. Method of birth control. Emotional well-being. Home life and relationship well-being. Sexual activity and sexual health. Lifestyle, including: Alcohol, nicotine or tobacco, and drug use. Access to firearms. Diet, exercise, and sleep habits. Work and work environment. Sunscreen use. Safety issues such as seatbelt and bike helmet use. Physical exam Your health care provider will check your: Height and weight. These may be used to calculate your BMI (body mass index). BMI is a measurement that tells if you are at a healthy weight. Waist circumference. This measures the distance around your waistline. This measurement also tells if you are at a healthy weight and may help predict your risk of certain diseases, such as type 2 diabetes and high blood pressure. Heart rate and blood pressure. Body temperature. Skin for abnormal spots. What immunizations do I need?  Vaccines are usually given at various ages, according to a schedule. Your health care provider will recommend vaccines for you based on your age, medical history, and lifestyle or other factors, such as travel or where you work. What tests do I need? Screening Your health care provider may recommend screening tests for certain conditions. This may include: Lipid and cholesterol levels. Diabetes screening. This is done by checking your blood sugar (glucose) after you have not eaten for a while (fasting). Pelvic exam and Pap test. Hepatitis B test. Hepatitis C  test. HIV (human immunodeficiency virus) test. STI (sexually transmitted infection) testing, if you are at risk. Lung cancer screening. Colorectal cancer screening. Mammogram. Talk with your health care provider about when you should start having regular mammograms. This may depend on whether you have a family history of breast cancer. BRCA-related cancer screening. This may be done if you have a family history of breast, ovarian, tubal, or peritoneal cancers. Bone density scan. This is done to screen for osteoporosis. Talk with your health care provider about your test results, treatment options, and if necessary, the need for more tests. Follow these instructions at home: Eating and drinking  Eat a diet that includes fresh fruits and vegetables, whole grains, lean protein, and low-fat dairy products. Take vitamin and mineral supplements as recommended by your health care provider. Do not drink alcohol if: Your health care provider tells you not to drink. You are pregnant, may be pregnant, or are planning to become pregnant. If you drink alcohol: Limit how much you have to 0-1 drink a day. Know how much alcohol is in your drink. In the [place] one drink equals one 12 oz bottle of beer (355 mL), one 5 oz glass of wine (148 mL), or one 1 oz glass of hard liquor (44 mL). Lifestyle Brush your teeth every morning and night with fluoride toothpaste. Floss one time each day. Exercise for at least 30 minutes 5 or more days each week. Do not use any products that contain nicotine or tobacco. These products include cigarettes, chewing tobacco, and vaping devices, such as e-cigarettes. If you need help quitting, ask your health care provider. Do not use drugs. If you are sexually active, practice safe sex. Use a condom or other form of protection to   prevent STIs. If you do not wish to become pregnant, use a form of birth control. If you plan to become pregnant, see your health care provider for a  prepregnancy visit. Take aspirin only as told by your health care provider. Make sure that you understand how much to take and what form to take. Work with your health care provider to find out whether it is safe and beneficial for you to take aspirin daily. Find healthy ways to manage stress, such as: Meditation, yoga, or listening to music. Journaling. Talking to a trusted person. Spending time with friends and family. Minimize exposure to UV radiation to reduce your risk of skin cancer. Safety Always wear your seat belt while driving or riding in a vehicle. Do not drive: If you have been drinking alcohol. Do not ride with someone who has been drinking. When you are tired or distracted. While texting. If you have been using any mind-altering substances or drugs. Wear a helmet and other protective equipment during sports activities. If you have firearms in your house, make sure you follow all gun safety procedures. Seek help if you have been physically or sexually abused. What's next? Visit your health care provider once a year for an annual wellness visit. Ask your health care provider how often you should have your eyes and teeth checked. Stay up to date on all vaccines. This information is not intended to replace advice given to you by your health care provider. Make sure you discuss any questions you have with your health care provider. Document Revised: 07/04/2020 Document Reviewed: 07/04/2020 Elsevier Patient Education  2023 Else[employer] Breast Self-Awareness Breast self-awareness is knowing how your breasts look and feel. You need to: Check your breasts on a regular basis. Tell your doctor about any changes. Become familiar with the look and feel of your breasts. This can help you catch a breast problem while it is still small and can be treated. You should do breast self-exams even if you have breast implants. What you need: A mirror. A well-lit room. A pillow or other  soft object. How to do a breast self-exam Follow these steps to do a breast self-exam: Look for changes  Take off all the clothes above your waist. Stand in front of a mirror in a room with good lighting. Put your hands down at your sides. Compare your breasts in the mirror. Look for any difference between them, such as: A difference in shape. A difference in size. Wrinkles, dips, and bumps in one breast and not the other. Look at each breast for changes in the skin, such as: Redness. Scaly areas. Skin that has gotten thicker. Dimpling. Open sores (ulcers). Look for changes in your nipples, such as: Fluid coming out of a nipple. Fluid around a nipple. Bleeding. Dimpling. Redness. A nipple that looks pushed in (retracted), or that has changed position. Feel for changes Lie on your back. Feel each breast. To do this: Pick a breast to feel. Place a pillow under the shoulder closest to that breast. Put the arm closest to that breast behind your head. Feel the nipple area of that breast using the hand of your other arm. Feel the area with the pads of your three middle fingers by making small circles with your fingers. Use light, medium, and firm pressure. Continue the overlapping circles, moving downward over the breast. Keep making circles with your fingers. Stop when you feel your ribs. Start making circles with your fingers again, this time going   upward until you reach your collarbone. Then, make circles outward across your breast and into your armpit area. Squeeze your nipple. Check for discharge and lumps. Repeat these steps to check your other breast. Sit or stand in the tub or shower. With soapy water on your skin, feel each breast the same way you did when you were lying down. Write down what you find Writing down what you find can help you remember what to tell your doctor. Write down: What is normal for each breast. Any changes you find in each breast. These  include: The kind of changes you find. A tender or painful breast. Any lump you find. Write down its size and where it is. When you last had your monthly period (menstrual cycle). General tips If you are breastfeeding, the best time to check your breasts is after you feed your baby or after you use a breast pump. If you get monthly bleeding, the best time to check your breasts is 5-7 days after your monthly cycle ends. With time, you will become comfortable with the self-exam. You will also start to know if there are changes in your breasts. Contact a doctor if: You see a change in the shape or size of your breasts or nipples. You see a change in the skin of your breast or nipples, such as red or scaly skin. You have fluid coming from your nipples that is not normal. You find a new lump or thick area. You have breast pain. You have any concerns about your breast health. Summary Breast self-awareness includes looking for changes in your breasts and feeling for changes within your breasts. You should do breast self-awareness in front of a mirror in a well-lit room. If you get monthly periods (menstrual cycles), the best time to check your breasts is 5-7 days after your period ends. Tell your doctor about any changes you see in your breasts. Changes include changes in size, changes on the skin, painful or tender breasts, or fluid from your nipples that is not normal. This information is not intended to replace advice given to you by your health care provider. Make sure you discuss any questions you have with your health care provider. Document Revised: 06/13/2021 Document Reviewed: 11/08/2020 Elsevier Patient Education  2023 [employer] Inc.  

## 2022-05-27 ENCOUNTER — Ambulatory Visit (INDEPENDENT_AMBULATORY_CARE_PROVIDER_SITE_OTHER): Payer: BC Managed Care – PPO | Admitting: Obstetrics and Gynecology

## 2022-05-27 ENCOUNTER — Encounter: Payer: Self-pay | Admitting: Obstetrics and Gynecology

## 2022-05-27 VITALS — BP 92/67 | HR 73 | Resp 16 | Ht 65.0 in | Wt 185.1 lb

## 2022-05-27 DIAGNOSIS — R635 Abnormal weight gain: Secondary | ICD-10-CM

## 2022-05-27 DIAGNOSIS — Z01419 Encounter for gynecological examination (general) (routine) without abnormal findings: Secondary | ICD-10-CM | POA: Diagnosis not present

## 2022-05-27 DIAGNOSIS — E785 Hyperlipidemia, unspecified: Secondary | ICD-10-CM

## 2022-05-27 DIAGNOSIS — Z1231 Encounter for screening mammogram for malignant neoplasm of breast: Secondary | ICD-10-CM

## 2022-05-27 DIAGNOSIS — Z975 Presence of (intrauterine) contraceptive device: Secondary | ICD-10-CM

## 2022-05-27 DIAGNOSIS — E039 Hypothyroidism, unspecified: Secondary | ICD-10-CM

## 2022-05-27 DIAGNOSIS — Z131 Encounter for screening for diabetes mellitus: Secondary | ICD-10-CM

## 2022-05-28 LAB — CBC
Hematocrit: 44.6 % (ref 34.0–46.6)
Hemoglobin: 14.7 g/dL (ref 11.1–15.9)
MCH: 30.8 pg (ref 26.6–33.0)
MCHC: 33 g/dL (ref 31.5–35.7)
MCV: 94 fL (ref 79–97)
Platelets: 219 10*3/uL (ref 150–450)
RBC: 4.77 x10E6/uL (ref 3.77–5.28)
RDW: 12.3 % (ref 11.7–15.4)
WBC: 9.7 10*3/uL (ref 3.4–10.8)

## 2022-05-28 LAB — LIPID PANEL
Chol/HDL Ratio: 2.4 ratio (ref 0.0–4.4)
Cholesterol, Total: 129 mg/dL (ref 100–199)
HDL: 53 mg/dL (ref >39)
LDL Chol Calc (NIH): 46 mg/dL (ref 0–99)
Triglycerides: 185 mg/dL — ABNORMAL HIGH (ref 0–149)
VLDL Cholesterol Cal: 30 mg/dL (ref 5–40)

## 2022-05-28 LAB — COMPREHENSIVE METABOLIC PANEL
ALT: 22 IU/L (ref 0–32)
AST: 25 IU/L (ref 0–40)
Albumin/Globulin Ratio: 1.7 (ref 1.2–2.2)
Albumin: 4.2 g/dL (ref 3.8–4.9)
Alkaline Phosphatase: 68 IU/L (ref 44–121)
BUN/Creatinine Ratio: 16 (ref 9–23)
BUN: 11 mg/dL (ref 6–24)
Bilirubin Total: 0.3 mg/dL (ref 0.0–1.2)
CO2: 23 mmol/L (ref 20–29)
Calcium: 9.3 mg/dL (ref 8.7–10.2)
Chloride: 101 mmol/L (ref 96–106)
Creatinine, Ser: 0.69 mg/dL (ref 0.57–1.00)
Globulin, Total: 2.5 g/dL (ref 1.5–4.5)
Glucose: 85 mg/dL (ref 70–99)
Potassium: 4.9 mmol/L (ref 3.5–5.2)
Sodium: 139 mmol/L (ref 134–144)
Total Protein: 6.7 g/dL (ref 6.0–8.5)
eGFR: 105 mL/min/{1.73_m2} (ref >59)

## 2022-05-28 LAB — HEMOGLOBIN A1C
Est. average glucose Bld gHb Est-mCnc: 105 mg/dL
Hgb A1c MFr Bld: 5.3 % (ref 4.8–5.6)

## 2022-05-28 LAB — VITAMIN D 25 HYDROXY (VIT D DEFICIENCY, FRACTURES): Vit D, 25-Hydroxy: 44.8 ng/mL (ref 30.0–100.0)

## 2022-05-28 LAB — TSH: TSH: 5.3 u[IU]/mL — ABNORMAL HIGH (ref 0.450–4.500)

## 2022-05-30 ENCOUNTER — Other Ambulatory Visit: Payer: Self-pay | Admitting: Obstetrics and Gynecology

## 2022-08-10 ENCOUNTER — Other Ambulatory Visit: Payer: Self-pay | Admitting: Obstetrics and Gynecology

## 2022-11-17 ENCOUNTER — Ambulatory Visit
Admission: RE | Admit: 2022-11-17 | Discharge: 2022-11-17 | Disposition: A | Discharge status: Home / Self Care | Payer: BC Managed Care – PPO | Source: Ambulatory Visit | Attending: Obstetrics and Gynecology | Admitting: Obstetrics and Gynecology

## 2022-11-17 DIAGNOSIS — Z1231 Encounter for screening mammogram for malignant neoplasm of breast: Secondary | ICD-10-CM | POA: Insufficient documentation

## 2022-11-17 DIAGNOSIS — Z01419 Encounter for gynecological examination (general) (routine) without abnormal findings: Secondary | ICD-10-CM | POA: Diagnosis not present

## 2022-11-17 DIAGNOSIS — R92333 Mammographic heterogeneous density, bilateral breasts: Secondary | ICD-10-CM | POA: Diagnosis not present

## 2023-06-02 ENCOUNTER — Ambulatory Visit: Payer: Self-pay | Admitting: Licensed Practical Nurse

## 2023-06-12 ENCOUNTER — Ambulatory Visit (INDEPENDENT_AMBULATORY_CARE_PROVIDER_SITE_OTHER): Payer: Self-pay | Admitting: Licensed Practical Nurse

## 2023-06-12 ENCOUNTER — Other Ambulatory Visit (HOSPITAL_COMMUNITY)
Admission: RE | Admit: 2023-06-12 | Discharge: 2023-06-12 | Disposition: A | Discharge status: Home / Self Care | Source: Ambulatory Visit | Attending: Licensed Practical Nurse | Admitting: Licensed Practical Nurse

## 2023-06-12 ENCOUNTER — Encounter: Payer: Self-pay | Admitting: Licensed Practical Nurse

## 2023-06-12 VITALS — BP 123/84 | HR 68 | Ht 65.0 in | Wt 189.5 lb

## 2023-06-12 DIAGNOSIS — Z01419 Encounter for gynecological examination (general) (routine) without abnormal findings: Secondary | ICD-10-CM | POA: Diagnosis present

## 2023-06-12 DIAGNOSIS — Z124 Encounter for screening for malignant neoplasm of cervix: Secondary | ICD-10-CM

## 2023-06-12 DIAGNOSIS — Z1329 Encounter for screening for other suspected endocrine disorder: Secondary | ICD-10-CM

## 2023-06-12 DIAGNOSIS — Z131 Encounter for screening for diabetes mellitus: Secondary | ICD-10-CM

## 2023-06-12 DIAGNOSIS — E7800 Pure hypercholesterolemia, unspecified: Secondary | ICD-10-CM

## 2023-06-12 DIAGNOSIS — Z1231 Encounter for screening mammogram for malignant neoplasm of breast: Secondary | ICD-10-CM

## 2023-06-12 NOTE — Progress Notes (Signed)
 Gynecology Annual Exam  PCP: Eartha Gold, MD  Chief Complaint:  Chief Complaint  Patient presents with   Annual Exam    History of Present Illness: Patient is a 53 y.o. [id number] presents for annual exam. The patient has some complaints today.   She does have symptoms of menopause: hot flashes, irritably, her symptoms are tolerable at the moment.   She had a sharp pain on her lower left side a few days for her cycle started, the pain was there for a few days, it was more noticeable when she would change to standing potions. She has never had this pain before, denies hx of cysts.   LMP: No LMP recorded. (Menstrual status: IUD). Average Interval: irregular, every few months. Just had a cycle 5/18 Postcoital Bleeding: no Dysmenorrhea: no   The patient is sexually active with 1 female partner. She currently uses IUD for contraception. She denies dyspareunia.  The patient does perform self breast exams.  There is no notable family history of breast or ovarian cancer in her family.  The patient wears seatbelts: yes.   The patient has regular exercise: no.    The patient reports current symptoms of depression.  Well managed with Celexa, stopped Buspar  because it was not helping.   She would like her "standard labs" drawn, she likes to have them drawn twice a year, last drawn by her PCP in November.   Works for the school system in administration Lives with her husband and their 3 grand daughters ages 42, 91, and 35, feels safe at home Describe Stress level as "ok" Dentist up to date Wears reading glasses, last eye exam 2 year ago Sees derm regularly  PCP last seen in November 2024  Denies tobacco/alcohol/illicit drug use  Pt is aware of her BMI, is not interested in weight loss   Review of Systems: ROS see HPI   Past Medical History:  Patient Active Problem List   Diagnosis Date Noted Date Diagnosed   IUD (intrauterine device) in place 06/15/2020     Mirena  IUD  inserted 05/18/2020    STD exposure 01/30/2015    Increased BMI 01/30/2015    Vulvitis 01/30/2015    Headache, migraine 01/30/2015     Overview:  perimenstrual migraines    Allergic rhinitis 07/07/2014    Abnormal finding on thyroid  function test 07/07/2014    Avitaminosis D 07/07/2014    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 (Menstrual status: IUD). Contraception: IUD Last Pap: Results were: 2022 NIL and HR HPV negative  Last mammogram: 10/2022 Results were: BI-RAD I  Obstetric History: [id number]  Family History:  Family History  Problem Relation Age of Onset   Diabetes Mother    Multiple sclerosis Mother    Heart disease Father    Diabetes Father    Sudden Cardiac Death Brother    Hyperlipidemia Paternal Grandmother    Colon cancer Paternal Uncle    Cancer Paternal Uncle        blood cancer  Thyroid  cancer Daughter 83       currently in remission   Breast cancer Neg Hx    Ovarian cancer Neg Hx     Social History:  Social History   Socioeconomic History   Marital status: Married    Spouse name: Not on file   Number of children: Not on file   Years of education: Not on file   Highest education level: Not on file  Occupational History   Not on file  Tobacco Use   Smoking status: Former    Current packs/day: 0.00    Types: Cigarettes    Quit date: 2000    Years since quitting: 25.4   Smokeless tobacco: Never  Vaping Use   Vaping status: Never Used  Substance and Sexual Activity   Alcohol use: No   Drug use: No   Sexual  activity: Yes    Birth control/protection: I.U.D.  Other Topics Concern   Not on file  Social History Narrative   Not on file   Social Drivers of Health   Financial Resource Strain: Low Risk  (12/05/2022)   Received from [facility]   Overall Financial Resource Strain (CARDIA)    Difficulty of Paying Living Expenses: Not hard at all  Food Insecurity: No Food Insecurity (12/05/2022)   Received from [facility]   Hunger Vital Sign    Worried About Running Out of Food in the Last Year: Never true    Ran Out of Food in the Last Year: Never true  Transportation Needs: No Transportation Needs (12/05/2022)   Received from [facility] - Transportation    In the past 12 months, has lack of transportation kept you from medical appointments or from getting medications?: No    Lack of Transportation (Non-Medical): No  Physical Activity: Inactive (02/03/2017)   Exercise Vital Sign    Days of Exercise per Week: 0 days    Minutes of Exercise per Session: 0 min  Stress: Not on file  Social Connections: Not on file  Intimate Partner Violence: Not on file    Allergies:  Allergies  Allergen Reactions   Azithromycin    Erythromycin Diarrhea    Tolerate Zpak    Medications: Prior to Admission medications   Medication Sig Start Date End Date Taking? Authorizing Provider  citalopram (CELEXA) 40 MG tablet Take 40 mg by mouth daily.   Yes [provider]  co-enzyme Q-10 30 MG capsule Take 30 mg by mouth 3 (three) times daily.   Yes [provider]  fexofenadine (ALLEGRA) 60 MG tablet Take 60 mg by mouth 2 (two) times daily.   Yes [provider]  ketoconazole (NIZORAL) 2 % shampoo Apply topically every other day. 06/06/23  Yes [provider]  levonorgestrel  (MIRENA ) 20 MCG/DAY IUD 1 each by Intrauterine route once. Inserted 05/18/2020 by Teresa Fender, MD at [facility] [place]    Yes [provider]  levothyroxine (SYNTHROID) 75 MCG tablet Take 75 mcg by mouth every morning.   Yes [provider]  mometasone (ELOCON) 0.1 % ointment Apply topically 2 (two) times daily as needed. 04/24/22  Yes [provider]  Multiple Vitamin (MULTIVITAMIN) capsule Take 1 capsule by mouth daily.   Yes [provider]  omeprazole (PRILOSEC) 20 MG capsule Take 20 mg by mouth. 12/03/22  Yes [provider]  RA KRILL OIL 500 MG CAPS Take by mouth.   Yes [provider]  rosuvastatin (CRESTOR) 5 MG tablet Take 5 mg by mouth daily.   Yes [provider]  SUMAtriptan (IMITREX) 100 MG tablet as needed. 06/27/15  Yes [provider]  VITAMIN D  PO Take 2,000 mg by mouth.    [provider]    Physical Exam Vitals: Blood pressure 123/84, pulse 68, height 5\' 5"  (1.651 m), weight 189 lb 8 oz (86 kg).  General: NAD HEENT: normocephalic, anicteric Thyroid : no enlargement, no palpable nodules Pulmonary: No increased work of breathing, CTAB Cardiovascular: RRR, distal pulses 2+ Breast: Breast symmetrical, no tenderness, no palpable nodules or masses, no skin or nipple retraction present, no nipple discharge.  No axillary or supraclavicular lymphadenopathy. Abdomen: NABS, soft, non-tender, non-distended.  Umbilicus without lesions.  No hepatomegaly, splenomegaly or masses palpable. No evidence of hernia  Genitourinary:  External: Normal external female genitalia.  Normal urethral meatus, normal Bartholin's and Skene's glands.    Vagina: Normal vaginal mucosa, no evidence of prolapse.  IUD string visualized, about 1cm, good tone   Cervix: Grossly normal in appearance, no bleeding  Uterus: Non-enlarged, mobile, normal contour.  No CMT  Adnexa: ovaries non-enlarged, no adnexal masses, some tenderness over left side   Rectal: deferred  Lymphatic: no evidence of inguinal lymphadenopathy Extremities: no edema, erythema, or  tenderness Neurologic: Grossly intact Psychiatric: mood appropriate, affect full  Assessment: 53 y.o. G2P2002 routine annual exam  Plan: Problem List Items Addressed This Visit   None Visit Diagnoses       Encounter for screening mammogram for malignant neoplasm of breast    -  Primary   Relevant Orders   MM DIGITAL SCREENING BILATERAL     Well woman exam       Relevant Orders   CBC   Comprehensive metabolic panel with GFR   TSH + free T4   Lipid panel   Hemoglobin A1c   Cytology - PAP   VITAMIN D  25 Hydroxy (Vit-D Deficiency, Fractures)     Screening for thyroid  disorder       Relevant Orders   TSH + free T4     Screening for diabetes mellitus       Relevant Orders   Hemoglobin A1c     Cervical cancer screening       Relevant Orders   Cytology - PAP     High cholesterol       Relevant Orders   Lipid panel       1) Mammogram - recommend yearly screening mammogram.  Mammogram Was ordered today   2) STI screening  wasoffered and declined  3) ASCCP guidelines and rational discussed.  Patient opts for every 3 years screening interval  4) Contraception - the patient is currently using  IUD.  She is happy with her current form of contraception and plans to continue  5) Colonoscopy-Coloc Guard 2022   -- Screening recommended starting at age 57 for average risk individuals, age 27 for individuals deemed at increased risk (including African Americans) and recommended to continue until age 23.  For patient age 48-85 individualized approach is recommended.  Gold standard screening is via colonoscopy, Cologuard screening is an acceptable alternative for patient unwilling or unable to undergo colonoscopy.  "Colorectal cancer screening for average?risk adults: 2018 guideline update from the American Cancer Society"[place]: A Cancer Journal for Clinicians: Jun 18, 2016   6) Routine healthcare maintenance including cholesterol, diabetes screening discussed Ordered today  7)Left sided  pain: the pain could be related to an ovarian  cyst. Offered US , pt prefers to monitor symptoms. Will call if she desires US .   Anice Kerbs, CNM   [facility]  06/12/23  2:56 PM

## 2023-06-13 LAB — COMPREHENSIVE METABOLIC PANEL WITH GFR
ALT: 10 IU/L (ref 0–32)
AST: 18 IU/L (ref 0–40)
Albumin: 4.4 g/dL (ref 3.8–4.9)
Alkaline Phosphatase: 80 IU/L (ref 44–121)
BUN/Creatinine Ratio: 16 (ref 9–23)
BUN: 11 mg/dL (ref 6–24)
Bilirubin Total: 0.3 mg/dL (ref 0.0–1.2)
CO2: 23 mmol/L (ref 20–29)
Calcium: 9.3 mg/dL (ref 8.7–10.2)
Chloride: 100 mmol/L (ref 96–106)
Creatinine, Ser: 0.7 mg/dL (ref 0.57–1.00)
Globulin, Total: 2.6 g/dL (ref 1.5–4.5)
Glucose: 76 mg/dL (ref 70–99)
Potassium: 4.6 mmol/L (ref 3.5–5.2)
Sodium: 140 mmol/L (ref 134–144)
Total Protein: 7 g/dL (ref 6.0–8.5)
eGFR: 104 mL/min/{1.73_m2} (ref >59)

## 2023-06-13 LAB — HEMOGLOBIN A1C
Est. average glucose Bld gHb Est-mCnc: 105 mg/dL
Hgb A1c MFr Bld: 5.3 % (ref 4.8–5.6)

## 2023-06-13 LAB — CBC
Hematocrit: 41.5 % (ref 34.0–46.6)
Hemoglobin: 13.4 g/dL (ref 11.1–15.9)
MCH: 30.2 pg (ref 26.6–33.0)
MCHC: 32.3 g/dL (ref 31.5–35.7)
MCV: 94 fL (ref 79–97)
Platelets: 267 10*3/uL (ref 150–450)
RBC: 4.43 x10E6/uL (ref 3.77–5.28)
RDW: 12.4 % (ref 11.7–15.4)
WBC: 9.3 10*3/uL (ref 3.4–10.8)

## 2023-06-13 LAB — LIPID PANEL
Chol/HDL Ratio: 3 ratio (ref 0.0–4.4)
Cholesterol, Total: 129 mg/dL (ref 100–199)
HDL: 43 mg/dL (ref >39)
LDL Chol Calc (NIH): 54 mg/dL (ref 0–99)
Triglycerides: 196 mg/dL — ABNORMAL HIGH (ref 0–149)
VLDL Cholesterol Cal: 32 mg/dL (ref 5–40)

## 2023-06-13 LAB — TSH+FREE T4
Free T4: 1.02 ng/dL (ref 0.82–1.77)
TSH: 1.51 u[IU]/mL (ref 0.450–4.500)

## 2023-06-13 LAB — VITAMIN D 25 HYDROXY (VIT D DEFICIENCY, FRACTURES): Vit D, 25-Hydroxy: 42.7 ng/mL (ref 30.0–100.0)

## 2023-06-22 LAB — CYTOLOGY - PAP
Comment: NEGATIVE
Diagnosis: NEGATIVE
High risk HPV: NEGATIVE

## 2023-12-29 LAB — COLOGUARD: COLOGUARD: NEGATIVE

## 2024-01-27 ENCOUNTER — Ambulatory Visit
Admission: RE | Admit: 2024-01-27 | Discharge: 2024-01-27 | Disposition: A | Discharge status: Home / Self Care | Source: Ambulatory Visit | Attending: Licensed Practical Nurse | Admitting: Licensed Practical Nurse

## 2024-01-27 DIAGNOSIS — Z1231 Encounter for screening mammogram for malignant neoplasm of breast: Secondary | ICD-10-CM | POA: Insufficient documentation
